# Patient Record
Sex: Male | Born: 1935 | Race: White | Hispanic: No | Marital: Single
Health system: Southern US, Community
[De-identification: ages and names within clinical notes are randomized; demographics above are authoritative.]

---

## 2003-03-21 ENCOUNTER — Other Ambulatory Visit: Payer: Self-pay

## 2003-06-16 ENCOUNTER — Other Ambulatory Visit: Payer: Self-pay

## 2004-05-05 ENCOUNTER — Ambulatory Visit: Payer: Self-pay | Admitting: Internal Medicine

## 2004-07-20 ENCOUNTER — Ambulatory Visit: Payer: Self-pay | Admitting: Internal Medicine

## 2004-09-14 ENCOUNTER — Emergency Department: Payer: Self-pay | Admitting: Emergency Medicine

## 2004-09-21 ENCOUNTER — Ambulatory Visit: Payer: Self-pay | Admitting: Internal Medicine

## 2007-02-16 ENCOUNTER — Ambulatory Visit: Payer: Self-pay | Admitting: Internal Medicine

## 2007-10-13 ENCOUNTER — Ambulatory Visit: Payer: Self-pay | Admitting: Internal Medicine

## 2007-11-12 ENCOUNTER — Ambulatory Visit: Payer: Self-pay | Admitting: Unknown Physician Specialty

## 2008-09-24 ENCOUNTER — Ambulatory Visit: Payer: Self-pay | Admitting: Internal Medicine

## 2008-10-09 ENCOUNTER — Emergency Department: Payer: Self-pay | Admitting: Internal Medicine

## 2008-12-30 ENCOUNTER — Ambulatory Visit: Payer: Self-pay | Admitting: Internal Medicine

## 2011-03-07 ENCOUNTER — Ambulatory Visit: Payer: Self-pay | Admitting: Internal Medicine

## 2011-09-29 ENCOUNTER — Inpatient Hospital Stay: Payer: Self-pay

## 2011-09-29 LAB — CBC
HCT: 35.7 % — ABNORMAL LOW (ref 40.0–52.0)
HGB: 11.5 g/dL — ABNORMAL LOW (ref 13.0–18.0)
MCH: 31.1 pg (ref 26.0–34.0)
MCHC: 32.1 g/dL (ref 32.0–36.0)
Platelet: 235 10*3/uL (ref 150–440)
RDW: 14.8 % — ABNORMAL HIGH (ref 11.5–14.5)
WBC: 9.3 10*3/uL (ref 3.8–10.6)

## 2011-09-29 LAB — COMPREHENSIVE METABOLIC PANEL
Albumin: 3.4 g/dL (ref 3.4–5.0)
Alkaline Phosphatase: 77 U/L (ref 50–136)
BUN: 36 mg/dL — ABNORMAL HIGH (ref 7–18)
Bilirubin,Total: 0.5 mg/dL (ref 0.2–1.0)
Chloride: 108 mmol/L — ABNORMAL HIGH (ref 98–107)
Creatinine: 1.49 mg/dL — ABNORMAL HIGH (ref 0.60–1.30)
Glucose: 90 mg/dL (ref 65–99)
Osmolality: 289 (ref 275–301)
Potassium: 4.2 mmol/L (ref 3.5–5.1)
SGOT(AST): 28 U/L (ref 15–37)
Sodium: 141 mmol/L (ref 136–145)
Total Protein: 7.3 g/dL (ref 6.4–8.2)

## 2011-09-29 LAB — URINALYSIS, COMPLETE
Bilirubin,UR: NEGATIVE
Blood: NEGATIVE
Glucose,UR: NEGATIVE mg/dL (ref 0–75)
Ketone: NEGATIVE
Leukocyte Esterase: NEGATIVE
Nitrite: NEGATIVE
Ph: 6 (ref 4.5–8.0)
RBC,UR: <1 /HPF (ref 0–5)
Squamous Epithelial: NONE SEEN
WBC UR: <1 /HPF (ref 0–5)

## 2011-09-29 LAB — PROTIME-INR
INR: 1
Prothrombin Time: 13.9 secs (ref 11.5–14.7)

## 2011-09-30 LAB — CBC WITH DIFFERENTIAL/PLATELET
Basophil #: 0 10*3/uL (ref 0.0–0.1)
Eosinophil #: 0.4 10*3/uL (ref 0.0–0.7)
Eosinophil %: 5.6 %
HGB: 10.1 g/dL — ABNORMAL LOW (ref 13.0–18.0)
MCH: 31.4 pg (ref 26.0–34.0)
MCV: 97 fL (ref 80–100)
Monocyte #: 0.8 x10 3/mm (ref 0.2–1.0)
Neutrophil %: 54.2 %
Platelet: 198 10*3/uL (ref 150–440)
RBC: 3.21 10*6/uL — ABNORMAL LOW (ref 4.40–5.90)
RDW: 15.1 % — ABNORMAL HIGH (ref 11.5–14.5)

## 2011-09-30 LAB — BASIC METABOLIC PANEL
BUN: 27 mg/dL — ABNORMAL HIGH (ref 7–18)
Calcium, Total: 8 mg/dL — ABNORMAL LOW (ref 8.5–10.1)
Co2: 26 mmol/L (ref 21–32)
Creatinine: 1.4 mg/dL — ABNORMAL HIGH (ref 0.60–1.30)
EGFR (African American): 51 — ABNORMAL LOW
Glucose: 90 mg/dL (ref 65–99)
Osmolality: 290 (ref 275–301)
Potassium: 4.2 mmol/L (ref 3.5–5.1)
Sodium: 143 mmol/L (ref 136–145)

## 2011-09-30 LAB — MAGNESIUM: Magnesium: 2 mg/dL

## 2011-10-01 LAB — CBC WITH DIFFERENTIAL/PLATELET
Eosinophil %: 3.7 %
HCT: 28.3 % — ABNORMAL LOW (ref 40.0–52.0)
Lymphocyte #: 2.3 10*3/uL (ref 1.0–3.6)
MCH: 32.2 pg (ref 26.0–34.0)
MCHC: 33.2 g/dL (ref 32.0–36.0)
MCV: 97 fL (ref 80–100)
Monocyte #: 1.1 x10 3/mm — ABNORMAL HIGH (ref 0.2–1.0)
Monocyte %: 11.8 %
Neutrophil #: 5.3 10*3/uL (ref 1.4–6.5)
Neutrophil %: 59 %
RDW: 14.7 % — ABNORMAL HIGH (ref 11.5–14.5)
WBC: 8.9 10*3/uL (ref 3.8–10.6)

## 2011-10-01 LAB — BASIC METABOLIC PANEL
BUN: 22 mg/dL — ABNORMAL HIGH (ref 7–18)
Chloride: 112 mmol/L — ABNORMAL HIGH (ref 98–107)
Co2: 26 mmol/L (ref 21–32)
Creatinine: 1.3 mg/dL (ref 0.60–1.30)
EGFR (African American): 55 — ABNORMAL LOW
Potassium: 4.4 mmol/L (ref 3.5–5.1)

## 2011-10-02 LAB — CBC WITH DIFFERENTIAL/PLATELET
Basophil #: 0 10*3/uL (ref 0.0–0.1)
Eosinophil #: 0.4 10*3/uL (ref 0.0–0.7)
Eosinophil %: 3.9 %
Lymphocyte #: 3.5 10*3/uL (ref 1.0–3.6)
MCH: 32.2 pg (ref 26.0–34.0)
MCHC: 33 g/dL (ref 32.0–36.0)
MCV: 97 fL (ref 80–100)
Monocyte #: 1.1 x10 3/mm — ABNORMAL HIGH (ref 0.2–1.0)
Neutrophil #: 4.7 10*3/uL (ref 1.4–6.5)
Platelet: 214 10*3/uL (ref 150–440)
RBC: 3.18 10*6/uL — ABNORMAL LOW (ref 4.40–5.90)
RDW: 15.1 % — ABNORMAL HIGH (ref 11.5–14.5)
WBC: 9.7 10*3/uL (ref 3.8–10.6)

## 2011-12-27 ENCOUNTER — Observation Stay: Payer: Self-pay

## 2011-12-27 LAB — PRO B NATRIURETIC PEPTIDE: B-Type Natriuretic Peptide: 313 pg/mL (ref 0–450)

## 2011-12-27 LAB — CK TOTAL AND CKMB (NOT AT ARMC): CK-MB: 2.4 ng/mL (ref 0.5–3.6)

## 2011-12-28 LAB — BASIC METABOLIC PANEL
Anion Gap: 5 — ABNORMAL LOW (ref 7–16)
Calcium, Total: 8.6 mg/dL (ref 8.5–10.1)
Chloride: 109 mmol/L — ABNORMAL HIGH (ref 98–107)
Co2: 27 mmol/L (ref 21–32)
Osmolality: 285 (ref 275–301)

## 2011-12-28 LAB — TROPONIN I: Troponin-I: 0.06 ng/mL — ABNORMAL HIGH

## 2011-12-29 LAB — BASIC METABOLIC PANEL
Anion Gap: 7 (ref 7–16)
Calcium, Total: 8.2 mg/dL — ABNORMAL LOW (ref 8.5–10.1)
Chloride: 112 mmol/L — ABNORMAL HIGH (ref 98–107)
Co2: 25 mmol/L (ref 21–32)
Creatinine: 1.58 mg/dL — ABNORMAL HIGH (ref 0.60–1.30)
EGFR (African American): 44 — ABNORMAL LOW
Osmolality: 290 (ref 275–301)

## 2012-05-07 ENCOUNTER — Emergency Department: Payer: Self-pay | Admitting: Emergency Medicine

## 2012-05-07 LAB — COMPREHENSIVE METABOLIC PANEL
Albumin: 3.1 g/dL — ABNORMAL LOW (ref 3.4–5.0)
Alkaline Phosphatase: 121 U/L (ref 50–136)
Anion Gap: 6 — ABNORMAL LOW (ref 7–16)
BUN: 21 mg/dL — ABNORMAL HIGH (ref 7–18)
Bilirubin,Total: 0.5 mg/dL (ref 0.2–1.0)
Calcium, Total: 8.9 mg/dL (ref 8.5–10.1)
Chloride: 105 mmol/L (ref 98–107)
Co2: 28 mmol/L (ref 21–32)
Creatinine: 1.63 mg/dL — ABNORMAL HIGH (ref 0.60–1.30)
EGFR (African American): 42 — ABNORMAL LOW
EGFR (Non-African Amer.): 36 — ABNORMAL LOW
Glucose: 94 mg/dL (ref 65–99)
Osmolality: 280 (ref 275–301)
Potassium: 4.2 mmol/L (ref 3.5–5.1)
SGOT(AST): 25 U/L (ref 15–37)
SGPT (ALT): 17 U/L (ref 12–78)
Sodium: 139 mmol/L (ref 136–145)
Total Protein: 8.3 g/dL — ABNORMAL HIGH (ref 6.4–8.2)

## 2012-05-07 LAB — URINALYSIS, COMPLETE
Bilirubin,UR: NEGATIVE
Blood: NEGATIVE
Leukocyte Esterase: NEGATIVE
Nitrite: NEGATIVE
Protein: 25
RBC,UR: 1 /HPF (ref 0–5)
WBC UR: 1 /HPF (ref 0–5)

## 2012-05-07 LAB — CBC WITH DIFFERENTIAL/PLATELET
Basophil #: 0 10*3/uL (ref 0.0–0.1)
Basophil %: 0.3 %
Eosinophil #: 0.2 10*3/uL (ref 0.0–0.7)
HCT: 38.2 % — ABNORMAL LOW (ref 40.0–52.0)
Lymphocyte #: 2.5 10*3/uL (ref 1.0–3.6)
Lymphocyte %: 23.2 %
MCH: 30.3 pg (ref 26.0–34.0)
MCHC: 33.2 g/dL (ref 32.0–36.0)
Monocyte %: 11.1 %
Neutrophil #: 6.8 10*3/uL — ABNORMAL HIGH (ref 1.4–6.5)
Neutrophil %: 63.4 %
Platelet: 589 10*3/uL — ABNORMAL HIGH (ref 150–440)
RDW: 15.9 % — ABNORMAL HIGH (ref 11.5–14.5)

## 2012-05-07 LAB — TROPONIN I: Troponin-I: 0.03 ng/mL

## 2012-05-07 LAB — CK TOTAL AND CKMB (NOT AT ARMC): CK, Total: 78 U/L (ref 35–232)

## 2012-05-15 ENCOUNTER — Inpatient Hospital Stay: Payer: Self-pay

## 2012-05-15 LAB — URINALYSIS, COMPLETE
Bilirubin,UR: NEGATIVE
Blood: NEGATIVE
Glucose,UR: NEGATIVE mg/dL (ref 0–75)
Hyaline Cast: 1
Ketone: NEGATIVE
Leukocyte Esterase: NEGATIVE
Nitrite: NEGATIVE
Ph: 6 (ref 4.5–8.0)
Protein: NEGATIVE
RBC,UR: NONE SEEN /HPF (ref 0–5)
Specific Gravity: 1.009 (ref 1.003–1.030)
WBC UR: NONE SEEN /HPF (ref 0–5)

## 2012-05-15 LAB — COMPREHENSIVE METABOLIC PANEL
Alkaline Phosphatase: 95 U/L (ref 50–136)
Bilirubin,Total: 0.6 mg/dL (ref 0.2–1.0)
Calcium, Total: 8.7 mg/dL (ref 8.5–10.1)
Co2: 27 mmol/L (ref 21–32)
Creatinine: 1.59 mg/dL — ABNORMAL HIGH (ref 0.60–1.30)
EGFR (African American): 43 — ABNORMAL LOW
EGFR (Non-African Amer.): 37 — ABNORMAL LOW
Glucose: 90 mg/dL (ref 65–99)
Osmolality: 277 (ref 275–301)
Potassium: 4.1 mmol/L (ref 3.5–5.1)
SGOT(AST): 25 U/L (ref 15–37)
SGPT (ALT): 17 U/L (ref 12–78)

## 2012-05-15 LAB — CBC
HCT: 36.5 % — ABNORMAL LOW (ref 40.0–52.0)
HGB: 11.9 g/dL — ABNORMAL LOW (ref 13.0–18.0)
MCH: 29.6 pg (ref 26.0–34.0)
MCHC: 32.6 g/dL (ref 32.0–36.0)
MCV: 91 fL (ref 80–100)
Platelet: 559 10*3/uL — ABNORMAL HIGH (ref 150–440)
RBC: 4.02 10*6/uL — ABNORMAL LOW (ref 4.40–5.90)
WBC: 11 10*3/uL — ABNORMAL HIGH (ref 3.8–10.6)

## 2012-05-15 LAB — CK TOTAL AND CKMB (NOT AT ARMC): CK-MB: 1.3 ng/mL (ref 0.5–3.6)

## 2012-05-16 LAB — CBC WITH DIFFERENTIAL/PLATELET
Eosinophil #: 0 10*3/uL (ref 0.0–0.7)
Eosinophil %: 0.1 %
HCT: 34.4 % — ABNORMAL LOW (ref 40.0–52.0)
HGB: 11.3 g/dL — ABNORMAL LOW (ref 13.0–18.0)
Lymphocyte #: 0.8 10*3/uL — ABNORMAL LOW (ref 1.0–3.6)
MCH: 29.7 pg (ref 26.0–34.0)
MCHC: 32.8 g/dL (ref 32.0–36.0)
Monocyte %: 1.3 %
Neutrophil %: 89 %
RDW: 15 % — ABNORMAL HIGH (ref 11.5–14.5)

## 2012-05-16 LAB — BASIC METABOLIC PANEL
Calcium, Total: 8.3 mg/dL — ABNORMAL LOW (ref 8.5–10.1)
Co2: 23 mmol/L (ref 21–32)
Glucose: 116 mg/dL — ABNORMAL HIGH (ref 65–99)
Potassium: 4.1 mmol/L (ref 3.5–5.1)

## 2012-05-21 LAB — CULTURE, BLOOD (SINGLE)

## 2012-05-22 DIAGNOSIS — M48061 Spinal stenosis, lumbar region without neurogenic claudication: Secondary | ICD-10-CM

## 2012-05-22 DIAGNOSIS — F329 Major depressive disorder, single episode, unspecified: Secondary | ICD-10-CM

## 2012-05-22 DIAGNOSIS — K219 Gastro-esophageal reflux disease without esophagitis: Secondary | ICD-10-CM

## 2012-05-22 DIAGNOSIS — G589 Mononeuropathy, unspecified: Secondary | ICD-10-CM

## 2012-05-30 ENCOUNTER — Inpatient Hospital Stay: Payer: Self-pay

## 2012-05-30 LAB — CBC
HGB: 12.1 g/dL — ABNORMAL LOW (ref 13.0–18.0)
MCH: 29.1 pg (ref 26.0–34.0)
MCV: 90 fL (ref 80–100)
Platelet: 502 10*3/uL — ABNORMAL HIGH (ref 150–440)
RBC: 4.17 10*6/uL — ABNORMAL LOW (ref 4.40–5.90)
WBC: 13.2 10*3/uL — ABNORMAL HIGH (ref 3.8–10.6)

## 2012-05-30 LAB — URINALYSIS, COMPLETE
Bilirubin,UR: NEGATIVE
Blood: NEGATIVE
Glucose,UR: NEGATIVE mg/dL (ref 0–75)
Hyaline Cast: 10
Ketone: NEGATIVE
Nitrite: NEGATIVE
Ph: 5 (ref 4.5–8.0)
Protein: NEGATIVE
Specific Gravity: 1.02 (ref 1.003–1.030)
Squamous Epithelial: NONE SEEN

## 2012-05-30 LAB — COMPREHENSIVE METABOLIC PANEL
Alkaline Phosphatase: 94 U/L (ref 50–136)
Bilirubin,Total: 0.3 mg/dL (ref 0.2–1.0)
Calcium, Total: 8.5 mg/dL (ref 8.5–10.1)
Creatinine: 1.61 mg/dL — ABNORMAL HIGH (ref 0.60–1.30)
EGFR (Non-African Amer.): 37 — ABNORMAL LOW
Glucose: 84 mg/dL (ref 65–99)
SGOT(AST): 27 U/L (ref 15–37)
SGPT (ALT): 22 U/L (ref 12–78)
Total Protein: 6.8 g/dL (ref 6.4–8.2)

## 2012-05-30 LAB — LIPID PANEL
Ldl Cholesterol, Calc: 69 mg/dL (ref 0–100)
Triglycerides: 169 mg/dL (ref 0–200)
VLDL Cholesterol, Calc: 34 mg/dL (ref 5–40)

## 2012-05-30 LAB — PROTIME-INR
INR: 1.1
Prothrombin Time: 14.1 secs (ref 11.5–14.7)

## 2012-05-30 LAB — CK TOTAL AND CKMB (NOT AT ARMC): CK, Total: 44 U/L (ref 35–232)

## 2012-05-30 LAB — TROPONIN I: Troponin-I: 0.03 ng/mL

## 2012-05-30 LAB — TSH: Thyroid Stimulating Horm: 0.35 u[IU]/mL — ABNORMAL LOW

## 2012-05-31 LAB — URINALYSIS, COMPLETE
Blood: NEGATIVE
Leukocyte Esterase: NEGATIVE
Nitrite: NEGATIVE
Ph: 6 (ref 4.5–8.0)
RBC,UR: NONE SEEN /HPF (ref 0–5)
Squamous Epithelial: <1

## 2012-05-31 LAB — CBC WITH DIFFERENTIAL/PLATELET
Eosinophil #: 0.3 10*3/uL (ref 0.0–0.7)
Eosinophil %: 2.5 %
HCT: 35.3 % — ABNORMAL LOW (ref 40.0–52.0)
Lymphocyte #: 1.8 10*3/uL (ref 1.0–3.6)
MCV: 90 fL (ref 80–100)
Monocyte %: 9.4 %
Neutrophil %: 72.7 %
RBC: 3.93 10*6/uL — ABNORMAL LOW (ref 4.40–5.90)
RDW: 15.2 % — ABNORMAL HIGH (ref 11.5–14.5)
WBC: 12.9 10*3/uL — ABNORMAL HIGH (ref 3.8–10.6)

## 2012-05-31 LAB — COMPREHENSIVE METABOLIC PANEL
Albumin: 2.2 g/dL — ABNORMAL LOW (ref 3.4–5.0)
Alkaline Phosphatase: 83 U/L (ref 50–136)
Anion Gap: 7 (ref 7–16)
Bilirubin,Total: 0.4 mg/dL (ref 0.2–1.0)
Co2: 25 mmol/L (ref 21–32)
Creatinine: 1.36 mg/dL — ABNORMAL HIGH (ref 0.60–1.30)
Glucose: 97 mg/dL (ref 65–99)
Potassium: 4.7 mmol/L (ref 3.5–5.1)
SGPT (ALT): 21 U/L (ref 12–78)

## 2012-05-31 LAB — PROTIME-INR: INR: 1.1

## 2012-06-04 DIAGNOSIS — E785 Hyperlipidemia, unspecified: Secondary | ICD-10-CM

## 2012-06-04 DIAGNOSIS — I4892 Unspecified atrial flutter: Secondary | ICD-10-CM

## 2012-06-04 DIAGNOSIS — F329 Major depressive disorder, single episode, unspecified: Secondary | ICD-10-CM

## 2012-06-04 DIAGNOSIS — R627 Adult failure to thrive: Secondary | ICD-10-CM

## 2012-06-11 DIAGNOSIS — R05 Cough: Secondary | ICD-10-CM

## 2012-06-19 DIAGNOSIS — R0902 Hypoxemia: Secondary | ICD-10-CM

## 2012-06-26 DIAGNOSIS — K219 Gastro-esophageal reflux disease without esophagitis: Secondary | ICD-10-CM

## 2012-06-26 DIAGNOSIS — I4891 Unspecified atrial fibrillation: Secondary | ICD-10-CM

## 2012-06-26 DIAGNOSIS — F068 Other specified mental disorders due to known physiological condition: Secondary | ICD-10-CM

## 2012-06-26 DIAGNOSIS — F329 Major depressive disorder, single episode, unspecified: Secondary | ICD-10-CM

## 2012-06-30 ENCOUNTER — Inpatient Hospital Stay: Payer: Self-pay

## 2012-06-30 LAB — COMPREHENSIVE METABOLIC PANEL
Albumin: 2.2 g/dL — ABNORMAL LOW (ref 3.4–5.0)
Anion Gap: 5 — ABNORMAL LOW (ref 7–16)
Bilirubin,Total: 0.3 mg/dL (ref 0.2–1.0)
Chloride: 101 mmol/L (ref 98–107)
EGFR (African American): 52 — ABNORMAL LOW
Glucose: 109 mg/dL — ABNORMAL HIGH (ref 65–99)
SGOT(AST): 108 U/L — ABNORMAL HIGH (ref 15–37)
Total Protein: 8 g/dL (ref 6.4–8.2)

## 2012-06-30 LAB — URINALYSIS, COMPLETE
Bilirubin,UR: NEGATIVE
Blood: NEGATIVE
Glucose,UR: NEGATIVE mg/dL (ref 0–75)
Ketone: NEGATIVE
Ph: 6 (ref 4.5–8.0)
Protein: 30
RBC,UR: 2 /HPF (ref 0–5)
WBC UR: 1 /HPF (ref 0–5)

## 2012-06-30 LAB — CBC
HCT: 40 % (ref 40.0–52.0)
HGB: 13 g/dL (ref 13.0–18.0)
MCH: 28.4 pg (ref 26.0–34.0)
MCHC: 32.4 g/dL (ref 32.0–36.0)
MCV: 88 fL (ref 80–100)
RBC: 4.58 10*6/uL (ref 4.40–5.90)

## 2012-06-30 LAB — TSH: Thyroid Stimulating Horm: 0.634 u[IU]/mL

## 2012-07-01 LAB — CBC WITH DIFFERENTIAL/PLATELET
Basophil #: 0.1 10*3/uL (ref 0.0–0.1)
Basophil %: 0.6 %
Eosinophil #: 0.4 10*3/uL (ref 0.0–0.7)
HGB: 11.5 g/dL — ABNORMAL LOW (ref 13.0–18.0)
Lymphocyte #: 2.8 10*3/uL (ref 1.0–3.6)
Lymphocyte %: 21.6 %
MCHC: 32.7 g/dL (ref 32.0–36.0)
MCV: 87 fL (ref 80–100)
Monocyte #: 1.4 x10 3/mm — ABNORMAL HIGH (ref 0.2–1.0)
Neutrophil #: 8.3 10*3/uL — ABNORMAL HIGH (ref 1.4–6.5)
Platelet: 470 10*3/uL — ABNORMAL HIGH (ref 150–440)
RDW: 15.5 % — ABNORMAL HIGH (ref 11.5–14.5)

## 2012-07-01 LAB — COMPREHENSIVE METABOLIC PANEL
Alkaline Phosphatase: 96 U/L (ref 50–136)
BUN: 16 mg/dL (ref 7–18)
Bilirubin,Total: 0.4 mg/dL (ref 0.2–1.0)
Calcium, Total: 8.4 mg/dL — ABNORMAL LOW (ref 8.5–10.1)
Chloride: 104 mmol/L (ref 98–107)
Co2: 29 mmol/L (ref 21–32)
Creatinine: 1.3 mg/dL (ref 0.60–1.30)
EGFR (African American): 55 — ABNORMAL LOW
Glucose: 90 mg/dL (ref 65–99)
Osmolality: 278 (ref 275–301)
SGPT (ALT): 62 U/L (ref 12–78)

## 2012-07-01 LAB — MAGNESIUM: Magnesium: 1.9 mg/dL

## 2012-07-02 ENCOUNTER — Telehealth: Payer: Self-pay | Admitting: Family Medicine

## 2012-07-02 LAB — BASIC METABOLIC PANEL
Anion Gap: 5 — ABNORMAL LOW (ref 7–16)
Calcium, Total: 8.1 mg/dL — ABNORMAL LOW (ref 8.5–10.1)
Chloride: 105 mmol/L (ref 98–107)
Creatinine: 1.36 mg/dL — ABNORMAL HIGH (ref 0.60–1.30)
EGFR (Non-African Amer.): 45 — ABNORMAL LOW
Sodium: 138 mmol/L (ref 136–145)

## 2012-07-02 LAB — CBC WITH DIFFERENTIAL/PLATELET
Basophil #: 0.1 10*3/uL (ref 0.0–0.1)
Basophil %: 0.6 %
Eosinophil #: 0.3 10*3/uL (ref 0.0–0.7)
Eosinophil %: 2.4 %
HCT: 32.6 % — ABNORMAL LOW (ref 40.0–52.0)
HGB: 10.6 g/dL — ABNORMAL LOW (ref 13.0–18.0)
MCHC: 32.6 g/dL (ref 32.0–36.0)
MCV: 87 fL (ref 80–100)
Neutrophil #: 7 10*3/uL — ABNORMAL HIGH (ref 1.4–6.5)
Neutrophil %: 61 %
Platelet: 423 10*3/uL (ref 150–440)
WBC: 11.5 10*3/uL — ABNORMAL HIGH (ref 3.8–10.6)

## 2012-07-02 LAB — URINE CULTURE

## 2012-07-02 NOTE — Telephone Encounter (Signed)
[facility] Triage Call Report Triage Record Num: [id number] Operator: Caswell Corwin Patient Name: Joel Lawson Call Date & Time: 06/30/2012 1:45:10PM Patient Phone: [phone] PCP: Tillman Abide Patient Gender: Male PCP Fax : [phone] Patient DOB: 09-11-1917 Practice Name: [facility] Reason for Call: Caller: Tamela Oddi RN From [facility]/Other; PCP: Tillman Abide [facility]); CB#: [phone]; Call regarding: Pt sent out to the hospital for lethargy. Pt was more alert this AM and took his AM med , but could hardly open his mouth for lunch. He was sent out by staff to [facility]. Protocol(s) Used: PCP Calls, No Triage (Adult) Recommended Outcome per Protocol: Call Provider Immediately Reason for Outcome: Notification of hospital admission Care Advice: ~ 06/30/2012 1:50:00PM Page 1 of 1 [id number]

## 2012-07-02 NOTE — Telephone Encounter (Signed)
Didn't hear about that this morning Will review tomorrow when there

## 2012-07-04 LAB — BASIC METABOLIC PANEL
Anion Gap: 8 (ref 7–16)
BUN: 8 mg/dL (ref 7–18)
Calcium, Total: 8.5 mg/dL (ref 8.5–10.1)
Chloride: 103 mmol/L (ref 98–107)
Creatinine: 1.17 mg/dL (ref 0.60–1.30)
EGFR (African American): >60
Potassium: 2.9 mmol/L — ABNORMAL LOW (ref 3.5–5.1)
Sodium: 139 mmol/L (ref 136–145)

## 2012-07-04 LAB — POTASSIUM: Potassium: 3.7 mmol/L (ref 3.5–5.1)

## 2012-07-05 DIAGNOSIS — I4891 Unspecified atrial fibrillation: Secondary | ICD-10-CM

## 2012-07-05 DIAGNOSIS — F068 Other specified mental disorders due to known physiological condition: Secondary | ICD-10-CM

## 2012-07-05 DIAGNOSIS — J690 Pneumonitis due to inhalation of food and vomit: Secondary | ICD-10-CM

## 2012-07-06 LAB — CULTURE, BLOOD (SINGLE)

## 2012-07-16 ENCOUNTER — Telehealth: Payer: Self-pay

## 2012-07-16 NOTE — Telephone Encounter (Signed)
Noted  

## 2012-07-16 NOTE — Telephone Encounter (Signed)
To: [facility] (After Hours Triage) Fax: [phone] From: [facility] Date/ Time: 07/16/2012 4:33 AM Taken By: Edgar Frisk, RN Caller: Tammy Facility: [name] Patient: Joel Lawson, Joel Lawson DOB: [birth year] Phone: [phone] Reason for Call: [name] is calling from [facility] regarding the death of [name]. Patient of [name]. The patient expired on 07/16/2012 at 04:18. Regarding Appointment:

## 2012-07-26 DEATH — deceased

## 2012-08-29 ENCOUNTER — Ambulatory Visit: Payer: Self-pay | Admitting: Internal Medicine

## 2014-07-15 NOTE — Discharge Summary (Signed)
PATIENT NAME:  Katina DungSCHMIDT, Waldon MR#:  [id number] DATE OF BIRTH:  15-Apr-1917  DATE OF ADMISSION:  12/27/2011 DATE OF DISCHARGE: 12/29/2011  PRIMARY CARE PHYSICIAN: Clydie Braunavid Erich Kochan, MD  ADMITTING PHYSICIAN: Clydie Braunavid Maurica Omura, MD  DISCHARGE DIAGNOSES:  1. Acute renal failure.  2. Dehydration.  3. Tachycardia.  HISTORY OF PRESENT ILLNESS: This is a pleasant 81 year old gentleman who lives alone in [place]. He is widowed. He came to clinic for his first visit on 12/27/2011 and was found to be very orthostatic, to have presyncopal episodes, appeared very dehydrated, and to have elevated creatinine above his baseline chronic failure. He was admitted for evaluation and treatment.   HOSPITAL COURSE BY ISSUE: 1. Acute renal failure. His creatinine had increased from 1.5 to 2.0. He was given IV fluids 2 liters and this decreased to around his baseline of 1.58.  2. Dizziness and orthostasis. This was likely due to the dehydration and acute renal failure. It resolved with treatment. He had an echocardiogram done which was normal. His blood work was also otherwise unrevealing except for very slight increase in his troponin to 0.06, which is likely due to supply/demand ischemia.  3. Debility. The patient was seen by physical therapy who recommended home physical therapy, but the patient has refused. He is relatively steady on his feet and will follow up at [facility] as needed.  4. Dehydration. This resolved with IV fluids. The patient was encouraged to continue to drink lots of fluid at home.  DISCHARGE MEDICATIONS: 1. Zoloft 50 mg once a day.  2. Simvastatin 40 mg once a day.  3. Omeprazole 40 mg once a day.  5. Vitamin D3 1000 units orally.  DISCHARGE DIET: Regular with lots of fluids. Can add salt to your  meals.   DISCHARGE ACTIVITY: As tolerated.   DISCHARGE FOLLOWUP/INSTRUCTIONS: The patient is to return to the clinic in 1 to 2 weeks for follow-up. He will call if there are further  symptoms. ____________________________ Stann Mainlandavid P. Sampson GoonFitzgerald, MD dpf:slb D: 12/29/2011 12:42:12 ET T: 12/29/2011 14:42:35 ET JOB#: [id number]  cc: Stann Mainlandavid P. Sampson GoonFitzgerald, MD, <Dictator> Ceniya Fowers Sampson GoonFITZGERALD MD ELECTRONICALLY SIGNED 01/03/2012 21:46

## 2014-07-15 NOTE — H&P (Signed)
PATIENT NAME:  Joel Lawson, SCHIELE MR#:  [id number] DATE OF BIRTH:  [birth year]  DATE OF ADMISSION:  12/27/2011  PRIMARY CARE PHYSICIAN: Clydie Braun, MD   REASON FOR ADMISSION/CHIEF COMPLAINT: Dizziness and palpitations.   HISTORY OF PRESENT ILLNESS: This is a pleasant 81 year old gentleman who currently lives alone. He presented today to the clinic with complaints of ongoing worsening dizziness. He has a baseline history of depression, diabetes, mild hypertension, prior TIA, and recent GI bleed requiring admission in July 2013 presumably due to diverticulitis. He was last seen by Dr. Candelaria Stagers on July 17th in follow-up for that hospitalization.   In the clinic the patient was noted to be tachycardic, hypotensive, and quite orthostatic. He reported no chest pain or shortness of breath but some mild lower quadrant abdominal pain. He has not had any fevers, chills, or night sweats. He says he is not drinking as much as he should.   PAST MEDICAL HISTORY:  1. Recent GI bleed July of 2007 presumably due to diverticulosis. He had a negative upper endoscopy.  2. Depression.  3. Diabetes versus hyperglycemia.  4. Hypertension, although not currently on meds.  5. Reflux and Barrett's esophagus.  6. 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  7. Hyperlipidemia.  8. Spinal stenosis.  9. 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   PAST SURGICAL HISTORY:  1. Tonsillectomy.  2. Bilateral cataract replacement.  3. Left carotid surgery in 2005.   SOCIAL HISTORY: The patient is a widower. He lives alone. He is at [facility]. He has a 30-pack-year smoking history but quit 30 years ago.   FAMILY HISTORY: His father had a pacemaker.   ALLERGIES: The patient is allergic to penicillin.   MEDICATIONS CURRENTLY INCLUDE:  1. Calcium 500 one tablet a day.  2. Multivitamin.  3. Omeprazole 40 mg once a day.  4. Zocor 40 mg once a day.  5. Zoloft 50 mg once a day.  6. Neosporin once a day.  REVIEW OF  SYSTEMS: 11 systems reviewed and negative except as per HPI.   PHYSICAL EXAMINATION:   GENERAL: The patient is a thin elderly male who appears frail but is in some distress and dizzy upon standing.   VITAL SIGNS: Temperature 98.9, pulse 108, blood pressure 98/60, weight 191 pounds, sat 95% on room air.   HEENT: Pupils were equal, round, and reactive to light and accommodation. He had evidence of cataract surgery. Extraocular movements were intact. Extraocular muscles were symmetric. Oropharynx showed mucous membranes were dry.   NECK: Supple.   HEART: Heart is regular but tachy.   LUNGS: Lungs had bilateral crackles.   ABDOMEN: Abdomen soft, nontender, nondistended. No hepatosplenomegaly.   EXTREMITIES: No clubbing, cyanosis, or edema.   NEUROLOGIC: He is alert and oriented x3, grossly nonfocal neuro exam.   LABORATORY, DIAGNOSTIC, AND RADIOLOGICAL DATA: EKG done revealed sinus tach with evidence of possible ischemia.   Labs done in clinic revealed a white count of 7.0, hemoglobin 12.2, platelets 256. Basic metabolic panel showed a BUN of 25, creatinine of 2.0 which is above his baseline of 1.4, glucose 112, sodium 138, potassium 4.2, chloride 106, bicarb 25.   On admission to the hospital cardiac enzymes were done. This revealed a CK of 80, MB of 2.4, and troponin of 0.03.   He has had a chest x-ray done on admission which showed mild atelectasis versus pneumonia. Left lung base is clear.   IMPRESSION: This is a 81 year old gentleman who is relatively healthy and lives  alone but now has orthostatic hypotension and tachycardia. He has a history of TIA and a GI bleed. He is admitted to evaluate his cardiac status and make sure he is not having any ongoing ischemia with an abnormal EKG.   PLAN:  1. Will cycle cardiac enzymes.  2. He is given IV fluids, normal saline at 125 mL an hour.  3. Will check an echocardiogram to evaluate his left ventricular function.  4. Will consider  carotid Doppler's given the dizziness above that would not explain orthostatic hypotension.  5. He will be placed on SCDs for DVT prophylaxis.   ____________________________ Stann Mainlandavid P. Sampson GoonFitzgerald, MD dpf:drc D: 12/27/2011 20:51:38 ET T: 12/28/2011 06:14:24 ET JOB#: [id number]  cc: Stann Mainlandavid P. Sampson GoonFitzgerald, MD, <Dictator> Jihan Mellette Sampson GoonFITZGERALD MD ELECTRONICALLY SIGNED 01/03/2012 21:46

## 2014-07-18 NOTE — H&P (Signed)
PATIENT NAME:  Katina DungSCHMIDT, Antavius MR#:  [id number] DATE OF BIRTH:  [birth year]  DATE OF ADMISSION:  05/15/2012  CHIEF COMPLAINT: Falls, weakness.   HISTORY OF PRESENT ILLNESS: This is a 81 year old male who has been having a very difficult time over the past few weeks with falls multiple times, hitting his head, chest, etc. He has been lying in bed, not getting up well, not eating and drinking according to his daughter. History of back disease and back pain. He takes a lot of Aleve for that despite his chronic kidney disease. His creatinine is 1.72 of late at [facility]. He was hungry and wished to eat. No cough, shortness of breath noted. He was seen at [facility] yesterday and started on Levaquin for a possible URI and was asked to have close followup. He is somewhat confused, apparently is more confused at times when he does not eat well, and again has been doing that more lately. History of neuropathy, possible spinal stenosis though that term has not been told to the daughter or patient as best they know. May have a distant history of diabetes, but his glucose has been normal of late.  REVIEW OF SYSTEMS: Negative other than above.   PAST MEDICAL HISTORY:  1.  Depression.  2.  History of glucose intolerance.  3.  Osteoarthritis. 4.  Hypertension.  5.  Sleep apnea. 6.  GERD with history of Barrett's. 7.  History of TIA with left hemiparesis and speech defect, now gone.  8.  History of herpes zoster.  9.  Hyperlipidemia.  10.  Spinal stenosis per the chart.   PAST SURGICAL HISTORY: Tonsillectomy, bilateral cataract replacement and left carotid surgery in 2005 by Dr. Michela PitcherEly.   MEDICATIONS: Per Dr. Jarrett AblesFitzgerald's last note include calcium daily, multivitamin daily, omeprazole 40 mg daily, Flonase 2 sprays daily, sertraline 50 mg daily, simvastatin 40 mg daily.   ALLERGIES: PENICILLIN.   SOCIAL HISTORY: [facility]. Is a widow. History of 30 pack-year cigarettes, quit 30 years ago.  No alcohol noted.   FAMILY HISTORY: Father had a pacemaker from some sort of cardiac conduction system abnormality, unknown exact type per the chart.   PHYSICAL EXAMINATION:  GENERAL: Elderly, mildly confused, lying on the gurney, no acute distress. VITAL SIGNS: Blood pressure 154/88, pulse 87, respirations 18, according to his admission vital signs.  HEENT: Normocephalic. Does have a contusion on his forehead. TMs clear. Oropharynx clear. Nasal mucosa normal.  NECK: No bruits, thyromegaly, adenopathy, JVD.  CHEST: Clear to auscultation, percussion and inspection.  HEART: Regular rate and rhythm without murmurs or gallops. Peripheral pulses decreased.  ABDOMEN: Soft, flat, nontender. No hepatosplenomegaly.  EXTREMITIES: No clubbing, cyanosis, edema. NEUROLOGIC: Slightly decreased strength in his legs. He has marked sensory neuropathy to light touch all the way from his feet to his knees without any feeling. Seems to be nonfocal otherwise.  LABORATORY AND RADIOLOGIC DATA: Labs show normal UA. Head CT and CT of his neck are shown to be normal. Troponin and CKs normal. His creatinine is 1.59, which again is in his usual range. His albumin is decreased to 2.6. WBC is 11, hemoglobin 11.9, platelets 559, MCV 91. Chest x-ray is read by radiology as no infiltrates. I reviewed the chest x-ray myself, did not see any infiltrates either.   ASSESSMENT AND PLAN:  1.  Worsening status with ataxia and confusion with this patient: Cause somewhat unclear, though suspect there is an element of spinal stenosis which may be worsening with his  falls, inability to get around and eat, Melvern Banker, recently. He is unsafe at home now. Will bring him into hospital. Consider MRI of his lumbar spine and empirically give him some prednisone. Get physical therapy to work with him. Suspect he will need admission to a skilled facility for further rehab.  2.  Encephalopathy: Could be from marked lack of food intake, possible  dehydration as well, versus multiple other issues. Will not start any antibiotics at this point. Will watch him to see what his temperature does, his white count, et Karie Soda. I see no focal infections right now.  3.  Chronic kidney disease: Certainly need to stop nonsteroidal antiinflammatory drugs. I told him and his daughter this tonight. Hopefully his renal function will improve some off of that. Will follow.  4.  Neuropathy: As above. Add a B12 and TSH for this as well as his encephalopathy. Hard to tell his baseline. Some of this could be more chronic as well.   ____________________________ Marya Amsler. Dareen Piano, MD mwa:jm D: 05/15/2012 18:14:32 ET T: 05/15/2012 19:21:58 ET JOB#: [id number]  cc: Marya Amsler. Dareen Piano, MD, <Dictator> Lauro Regulus MD ELECTRONICALLY SIGNED 05/16/2012 8:07

## 2014-07-18 NOTE — H&P (Signed)
Past Med/Surgical Hx:  stroke:   diabetes:   chronic back pain:   peripheral neuropathy on lower legs:   Depression:   Hyperlipidemia:   GERD - Esophageal Reflux:   ALLERGIES:  PCN: Other   Assessment/Admission Diagnosis 81 yo male with PMHx of HTN, HLD, Sleep Apnea, Barrett's Esophagus, Esophageal Stricture, now with AMS, admitted for PNA  1. Pneumonia - most likely aspiration, bilateral infiltrates (R>L) - will treat as aspiration PNA, treat with vanc and zosyn (renal dosing) - I suspect he has been silently aspirating given his clinical status and recent CXR findings - give gentle IVFs - puree diet - DNR - daughter at bedside, DNR verfied - check blood and urine culture  2. AMS - secondary to #1  3. Esophageal stricture - most likely cause of aspiration - had dilation by Dr. Bluford Kaufmannh 09/2011 - give PPI - Spoke with Dr. Niel HummerIftikhar, will see patient tomorrow - speech eval - may need to consider feeding tube if no intervention   4. HTN - continue with home meds  5. Renal Insufficieny - most likely chronic - current not worst from last admission, will cont with gentle IVFs  DNR PMD - Dr.Fitzgerald  Time spent evaluating patient = 45 minutes [id number]#[id number]   Electronic Signatures: Stephanie AcreMungal, Craven Crean (MD)  (Signed 05-Apr-14 17:44)  Authored: PAST MEDICAL/SURGIAL HISTORY, ALLERGIES, HOME MEDICATIONS, ASSESSMENT AND PLAN   Last Updated: 05-Apr-14 17:44 by Stephanie AcreMungal, Kyiesha Millward (MD)

## 2014-07-18 NOTE — Consult Note (Signed)
Brief Consult Note: Diagnosis: Aspiration pneumonia.   Patient was seen by consultant.   Comments: Patient with aspiration pneumonia. Detailed history is not available but paient is giving h/o choking and dysphagia. Recent EGD showed only a mild stricture which was dilated to 54 FR. Doubt that aspiration is secondary to esophageal disorder and is most likely secondary to neuromuscular dysphagia. Agree with speech evaluation. Further recommendations to follow.  Electronic Signatures: Lurline DelIftikhar, Dalexa Gentz (MD)  (Signed 06-Apr-14 16:04)  Authored: Brief Consult Note   Last Updated: 06-Apr-14 16:04 by Lurline DelIftikhar, Oliviagrace Crisanti (MD)

## 2014-07-18 NOTE — Discharge Summary (Signed)
PATIENT NAME:  Joel Lawson, [name] MR#:  [id number] DATE OF BIRTH:  18-Mar-1918  DATE OF ADMISSION:  06/30/2012 DATE OF DISCHARGE: 07/04/2012   PRIMARY CARE PHYSICIAN: Clydie Braunavid Khadija Thier.  CONSULTING GASTROENTEROLOGIST: Dr. Niel HummerIftikhar.   DISCHARGE DIAGNOSES:  1.  Aspiration pneumonia.  2.  Atrial flutter.  3.  Debility.   HISTORY OF PRESENT ILLNESS: Please see admission history and physical. Briefly, this is an elderly gentleman, who lives at skilled nursing facility, who came in with altered mental status, cough and fever and found to have a likely aspiration pneumonia on chest x-ray.   HOSPITAL COURSE BY ISSUE:  1.  Aspiration pneumonia. He was treated with vancomycin and Zosyn. He remained afebrile. White count normalized and his oxygenation remained stable. He was seen by speech and swallow, who recommended dietary changes based on his aspiration risk.  2.  Atrial fibrillation and flutter. He remained on him diltiazem and metoprolol. Heart rate was stable.  3.  Volume overload. The patient received some IV fluids, likely became volume overloaded. He received one dose of IV Lasix on the 04/08 with good response.  4.  Debility. The patient was seen by physical therapy, who recommend continued skilled nursing facility as an outpatient.   DISCHARGE MEDICATIONS:  1.  Aleve 220 mg q. 8 hours p.r.n. pain.  2.  Tylenol 325 mg 2 tablets q. 4 hours p.r.n. pain.  3.  Sertraline 50 mg once a day.  4.  Remeron 15 mg at bedtime.  5.  Claritin 10 mg once a day p.r.n.  6.  Aspirin 81 mg once a day.  7.  Metoprolol 25 mg 1/2 tablet b.i.d.  8.  Diltiazem extended release 240 mg 1 capsule daily.  9.  Omeprazole 40 mg once a day.  10. Levofloxacin 500 mg once a day for 5 days.   DISCHARGE DIET: Low-sodium.    DISCHARGE DIET TYPE: Mechanical soft with thin liquids.   DISCHARGE DIET INSTRUCTIONS: No straws. Strict aspiration precautions. Moisten food well. Magic cup ice cream at lunch and dinner. The  patient must sit fully upright with all oral intake. Tray set up and feeding at all meals. Allow the patient to start feeding himself at meals, but finish feeding the patient the meal due to his quick fatigue and the upper extremity shakiness.   ACTIVITY: As tolerated.   CODE STATUS: The patient is DNR.   DISCHARGE FOLLOWUP: The patient will follow up with Dr. Sampson GoonFitzgerald in 1 to 2 weeks.   DISCHARGE DURATION: This discharge took 35 minutes.  ____________________________ Stann Mainlandavid P. Sampson GoonFitzgerald, MD dpf:aw D: 07/04/2012 08:26:01 ET T: 07/04/2012 08:57:35 ET JOB#: [id number]  cc: Stann Mainlandavid P. Sampson GoonFitzgerald, MD, <Dictator> Kaelyn Nauta Sampson GoonFITZGERALD MD ELECTRONICALLY SIGNED 07/04/2012 23:28

## 2014-07-18 NOTE — Consult Note (Signed)
Chief Complaint:  Subjective/Chief Complaint Speech evaluation appreciated. Doubt that aspiration is secondary to esophageal disorder as only a mild esophageal stricture was seen on recent EGD nad was dilated. Agree with mechanical soft diet. No further recommendations. Will sign off. Please call us if needed. Thanks.   Electronic Signatures: Lurline DelIftikhar, Avielle Imbert (MD)  (Signed 07-Apr-14 18:20)  Authored: Chief Complaint   Last Updated: 07-Apr-14 18:20 by Lurline DelIftikhar, Karmelo Bass (MD)

## 2014-07-18 NOTE — Consult Note (Signed)
PATIENT NAME:  Joel Lawson, Joel Lawson MR#:  [id number] DATE OF BIRTH:  1917-08-20  DATE OF CONSULTATION:  07/02/2012  CONSULTING PHYSICIAN:  Lurline DelShaukat Anely Spiewak, MD  REASON FOR CONSULTATION: Dysphagia and aspiration pneumonia.   HISTORY OF PRESENT ILLNESS: The patient is a 81 year old male with a history of hypertension, hyperlipidemia, sleep apnea.  The patient was admitted with probable aspiration pneumonia. The patient is a very poor historian, and very little history is available from the patient. Apparently, for quite some time he has been having trouble with choking and coughing during meals with both solids and liquids. A few months ago, the patient underwent an upper GI endoscopy. Barrett's esophagus was noted as well as a mild distal esophageal stricture which was dilated to 54-French using a Maloney dilator by Dr. Bluford Kaufmannh. The patient denies any other significant GI issues such as nausea, vomiting, abdominal pain, etc.   PAST MEDICAL HISTORY: Significant for hypertension, depression, osteoarthritis, sleep apnea, history of mild esophageal stricture as mentioned above, history of herpes zoster, hyperlipidemia, spinal stenosis.   ALLERGIES: PENICILLIN.   SOCIAL HISTORY: He is a resident of [place]. He has a 30 pack-year history of smoking, quit about 30 years ago.   FAMILY HISTORY: Unremarkable.   HOME MEDICATIONS: Include Aleve, diltiazem, docusate sodium, loratadine, metoprolol, omeprazole, Remeron, Senna, sertraline, multivitamin, Tylenol and vitamin D3.   REVIEW OF SYSTEMS: Negative except for what is mentioned in the history of present illness.   PHYSICAL EXAMINATION: GENERAL: Elderly, pretty lethargic-appearing male, does not appear to be in any acute distress.  VITAL SIGNS: Temperature 98.2, pulse 85, respirations 16, blood pressure 126/74.  SKIN: Somewhat pale.  NECK: Veins are flat.  LUNGS: Clear to auscultation.  CARDIOVASCULAR: Regular rate and rhythm. Normal S1, S2. A 2/6 systolic  murmur.  ABDOMEN: Quite soft and benign. Bowel sounds positive. Nontender, nondistended. No rebound or guarding was noted.   LABORATORY AND RADIOLOGICAL DATA:  White cell count 11.5, hemoglobin 10.6, hematocrit 32.6, platelet count of 423. Electrolytes are fairly unremarkable. Creatinine is 1.36. Urinalysis is unremarkable.   CT scan of the head without acute findings.   ASSESSMENT AND PLAN: The patient is with acute mental status changes and what appears to be pneumonia which is suspicious for aspiration pneumonia. The patient is giving history of coughing, choking and dysphagia with both solids and liquids. Based on his overall physical appearance, this appears to be dysphagia of neuromuscular origin. EGD a few months ago was quite unremarkable except for a mild esophageal stricture that was dilated, and I doubt that esophageal stricture has anything to do with the patient's aspiration and aspiration pneumonia. The patient has Barrett's esophagus which would not contribute to aspiration or dysphagia. I agree with the speech therapy evaluation. The patient was evaluated by a speech therapist, and a mechanical soft diet has been recommended. No further recommendations from the GI standpoint. The patient can follow with Dr. Lutricia FeilPaul Oh as outpatient, although if the patient continues to have significant dysphagia while in the hospital, GI can be reconsulted. As mentioned above, the dysphagia appears to be neuromuscular in origin, and there are no further GI recommendations. At this point we will sign off. Please do not hesitate to call me if we can be of any further assistance.   ____________________________ Lurline DelShaukat Analiz Tvedt, MD si:cb D: 07/02/2012 18:04:48 ET T: 07/02/2012 20:26:52 ET JOB#: [id number]  cc: Lurline DelShaukat Janiyla Long, MD, <Dictator> Lurline DelSHAUKAT Donevan Biller MD ELECTRONICALLY SIGNED 07/03/2012 15:30

## 2014-07-18 NOTE — Discharge Summary (Signed)
PATIENT NAME:  Joel Lawson, Joel Lawson MR#:  [id number] DATE OF BIRTH:  1917/10/21  DATE OF ADMISSION:  05/15/2012 DATE OF DISCHARGE:  05/17/2012  PRIMARY CARE PHYSICIAN: Clydie Braunavid Briena Swingler, MD  DISCHARGE DIAGNOSES: 1. Encephalopathy.  2. Ataxia and falls.  3. Depression.  4. Renal insufficiency.   HISTORY OF PRESENT ILLNESS: Please see admission history and physical. Briefly, this patient was having increasing confusion and fall at home. He lives in [place]. He was admitted after being unable to walk and had lower extremity weakness and bruising.   HOSPITAL COURSE:  1. Altered mental status.  This improved with IV fluids and antibiotics.  2. Ataxia. He was seen by physical therapy. He likely has some spinal stenosis. He was treated with prednisone taper. Physical therapy is recommended at skilled nursing facility.  3. ID.  The patient likely has evidence of a pneumonia with an elevated white count and has been treated as an outpatient with levofloxacin. Continue levofloxacin course as an outpatient.  4. Renal insufficiency. The patient's creatinine stabilized.  DISCHARGE MEDICATIONS: 1  simvastatin.  2. Sertraline 50 mg once a day.  3. Omeprazole 40 mg once a day.  4. Vitamin D 1000 once a day.  5. Bayer aspirin 81 mg once a day.  6. Multivitamin once a day.   320 q. 8 hours.  8. Levofloxacin 250 mg once a day. Finish course of 10 days.  9. Prednisone 20 mg 3 tablets a day for 1 day, then 2 tablets a day for 3 days, then 1 tablet a day for 3 days.    DISCHARGE DIET: Regular consistency, low sodium, low fat.  CODE STATUS: The patient is DNR. DNR form is placed on the chart.   DISCHARGE FOLLOWUP: With Dr. Sampson GoonFitzgerald in 1 to 2 weeks.  DISCHARGE TIME: 35 minutes. ____________________________ Stann Mainlandavid P. Sampson GoonFitzgerald, MD dpf:sb D: 05/17/2012 08:39:37 ET T: 05/17/2012 08:57:56 ET JOB#: [id number]  cc: Stann Mainlandavid P. Sampson GoonFitzgerald, MD, <Dictator> Vanetta Rule Sampson GoonFITZGERALD MD ELECTRONICALLY SIGNED 05/30/2012  19:25

## 2014-07-18 NOTE — Discharge Summary (Signed)
PATIENT NAME:  Joel Lawson, Joel Lawson MR#:  [id number] DATE OF BIRTH:  04-10-1917  DATE OF ADMISSION:  05/30/2012 DATE OF DISCHARGE:  06/01/2012  DISCHARGE DIAGNOSES: 1.  Atrial flutter with rapid ventricular response.  2.  Chest pain, negative enzymes.  3.  Depression.  4.  Debility.  5.  Acute renal failure, resolved.   HISTORY OF PRESENT ILLNESS:  Please see admission history and physical from March 5th.  Briefly, this is a 81 year old gentleman who has been having increasing debility and has moved from an assisted living into a skilled nursing facility.  He was readmitted March 5th after a recent admission after he was found to have tachycardia and be in atrial flutter.  He had only recently been discharged February 20th after a brief admission for increasing confusion, ataxia and possible pneumonia.   HOSPITAL COURSE BY ISSUE:  1.  Atrial flutter.  The patient remained stable, but did have a few episodes where his heart rate would go up to 130 when he would get up to use the toilet.  He had minimal symptoms, but did have a little bit of dizziness with it.  He was treated with diltiazem 240 mg once a day as well as metoprolol 12.5 twice a day.  He mainly converted back to sinus rhythm with only a few episodes of atrial flutter.  The plan is to continue him on these meds and his aspirin.  They can be up-titrated as needed.  His blood pressure was elevated, but I think he will tolerate these medications.  2.  Depression.  The patient is quite depressed, has a flat affect.  I did ask psych to see him, but there were no further recommendations.  He remains on the Remeron and Zoloft.  He had been losing weight which is why the Remeron had been added at his last visit.  3.  Chest pain.  His troponins were negative and he had no further chest pain.   4.  Debility.  He needs to return to skilled nursing facility to work with physical therapy.  5.  Acute renal failure.  On admission patient's creatinine was  elevated at 1.6.  With IV fluids it came down to 1.36.  Of note, the patient did have mildly persistently elevated white count of 12.9, but urinalysis and chest x-ray were negative.  The patient had no fevers throughout his hospitalization.    DISCHARGE LABORATORY DATA:  On March 6th, BUN was 28, creatinine 1.36, potassium 4.7.  The rest of the electrolytes are normal.  LFTs were within normal limits except albumin of 2.2.  Troponins were negative.  White blood count 12.9, hemoglobin 11.5, platelets were 60 with INR 1.1.  Urinalysis negative.  CT scan of the head is negative for any acute pathology.   DISCHARGE MEDICATIONS: 1.  Sertraline 50 mg once a day.  2.  Omeprazole 40 once a day.  3.  Vitamin D3 once a day.  4.  Aspirin 81 mg once a day.  5.  Aleve 220 1 tablet every 8 hours as needed for pain.  6.  Remeron 15 mg at bedtime.  7.  Tylenol 325 2 tablets every 4 hours as needed.  8.  Metoprolol 12.5 mg 2 times a day.  9.  Diltiazem 240 1 capsule once a day.  10.  Senna 1 capsule twice a day as needed for constipation.  11.  Colace 100 mg 1 capsule twice a day as needed.   HOME OXYGEN:  None.  DISCHARGE DIET:  Low sodium, regular consistency.   ACTIVITY:  As tolerated.   CODE STATUS:  THE PATIENT IS DO NOT RESUSCITATE.  FOLLOW-UP:  The patient needs to follow up within 1 to 2 weeks with Dr. Sampson GoonFitzgerald.    ISSUES TO ADDRESS AT FOLLOW-UP:  The patient will need to have evaluation of his heart rate and blood pressure and adjust the metoprolol, diltiazem as needed.  We will also need repeat  bmet  and CBC to evaluate his mild acute renal failure on admission and his mildly elevated white count.   TIME SPENT:  This discharge took 40 minutes.     ____________________________ Stann Mainlandavid P. Sampson GoonFitzgerald, MD dpf:ea D: 06/01/2012 21:53:58 ET T: 06/01/2012 22:40:23 ET JOB#: [id number]  cc: Stann Mainlandavid P. Sampson GoonFitzgerald, MD, <Dictator> DAVID Sampson GoonFITZGERALD MD ELECTRONICALLY SIGNED 06/03/2012 11:46

## 2014-07-18 NOTE — Consult Note (Signed)
PATIENT NAME:  Joel Lawson, Joel Lawson MR#:  [id number] DATE OF BIRTH:  [birth year]  DATE OF CONSULTATION:  05/31/2012  REFERRING PHYSICIAN:  Stann Mainland. Sampson Goon, MD CONSULTING PHYSICIAN:  Ardeen Fillers. Garnetta Buddy, MD  REASON FOR CONSULTATION:  Depression.   HISTORY OF PRESENT ILLNESS:  The patient is a 81 year old white male with long history of hypertension and depression who was admitted due to new onset atrial flutter. The patient was recently discharged from the hospital to the [facility] on February 20th by Dr. Sampson Goon, but he started having symptoms again. He came to the hospital because he had a fall with a head injury and was having some pain behind his eyes and headache. The patient reported that he came to the hospital and had a CT scan done. The patient reported that he was feeling weak for the last few days and was not able to do his activities of daily living. He was also complaining of dry cough and substernal chest pressure along with fluctuating blood pressure for the past few days. He was found to have new onset atrial flutter with a rate of 106 per minute. He was admitted for further evaluation and management and was found to be severely depressed during this admission.   During my interview, the patient reported that he lives by himself in [place] for the past 14 to 15 years. He stated that he has been following Dr. Sampson Goon on an outpatient basis who has started him on the combination of Zoloft and Remeron. He has been compliant with his medications, but he does not feel that the medications have been helpful. He reported that he spends time walking around in the [facility]. He stated that he does not want to change any of his medications at this time and does not want to see a psychiatrist. He reported that he used to see a psychiatrist in the past and has seen a physician in the [facility], but he thinks that the psychiatrist charged too much. The patient reported that he has a good rapport  with Dr. Sampson Goon and he will discuss his symptoms with him. The patient reported that he came here because of the problems with his heart and he was feeling dizzy, but now he has started improving. He currently denied having any suicidal ideation or plans. He reported that he is not having any paranoia. He reported that he is gradually improving on his medications.   PAST PSYCHIATRIC HISTORY:  The patient reported a long history of severe depression. He reported that he received ECT treatment when he was in [place] almost 50 to 60 years ago. He reported that he does not remember how many treatments he received at that time. The patient stated that he improved after the ECT treatment. He does not remember the name of the medications which were prescribed around that time. He stated that he is not willing to see any psychiatrist on an outpatient basis.   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   PAST SURGICAL HISTORY:  Tonsillectomy, bilateral cataract replacement and left carotid surgery in 2005.   ALLERGIES:  PENICILLIN.   SOCIAL HISTORY:  The patient reported that he widowed and lives by himself in [place]. He stated that his daughter works at [employer] in [place]. She lives in [place]. He has a good  relationship with her.   FAMILY HISTORY:  The patient has a family history of cardiac conduction deficit.   CURRENT MEDICATIONS:  Sertraline 50 mg daily, Remeron 15 mg at bedtime, vitamin D3 at 1000 International Units per day, omeprazole 40 mg daily, aspirin 81 mg daily, Aleve 220 mg q. 8 hours p.r.n. as needed.   REVIEW OF SYSTEMS:   CONSTITUTIONAL: Positive for fatigue and weakness.  EYES: No double or blurred vision.  ENT: No tinnitus or ear pain.  RESPIRATORY: Denies any  wheezing or hemoptysis. Positive for dry cough.  CARDIOVASCULAR: Substernal chest pressure positive.  GASTROINTESTINAL: No nausea, vomiting or diarrhea.  GENITOURINARY: No dysuria or hematuria.  ENDOCRINE: No polyuria, nocturia.  HEMATOLOGY: No anemia or easy bruising.  SKIN: No rash or lesion.  MUSCULOSKELETAL: History of osteoarthritis and spinal stenosis.  NEUROLOGIC: No tingling or numbness, positive for weakness.   PHYSICAL EXAMINATION: VITAL SIGNS: Temperature 99, pulse 96, respiration 22, blood pressure 118/62.   LABORATORY DATA:  Glucose is 97, BUN 28, creatinine 1.36, sodium 139, potassium 4.7, chloride 107, bicarbonate 25, anion gap 7, magnesium 2.1, protein 6.5, albumin 2.2, AST 29 and ALT 21. Troponin is 0.02. WBC is 12.9, RBC 3.93, hemoglobin 11.5, hematocrit 35.3, platelet count 460, RDW 15.2, neutrophils 9.4, monocytes 1.2. Prothrombin is 14.2 and INR is 1.1.   MENTAL STATUS EXAMINATION:  The patient is a moderately built male who was lying in the bed. He maintained fair eye contact. His speech was low in tone and volume. Mood was depressed and affect was blunted. Thought process was logical and goal-directed. Thought content was non-delusional. He currently denies having any suicidal ideation or plan. His memory was good and intact. He demonstrated fair insight and judgment. No perceptual disturbances were noted.   DIAGNOSTIC IMPRESSION: AXIS I: Major depressive disorder, recurrent, moderate.  AXIS II: Personality disorder, not otherwise specified.  AXIS III: Please review the medical history.   TREATMENT PLAN:   1.  I discussed with the patient at length about the medication, treatment risks, benefits and alternatives.  2.  He does not want to have his medications adjusted at this time and reported that he will go back with Dr. Sampson GoonFitzgerald and discuss with him about his psychotropic medications. He stated that he also does not want to follow up with psychiatry as he thinks  that the psychiatrist charged too much. He will decide about that later. I have left my information with the patient and advised him that he can think about it and if he is willing, he can follow up as an outpatient. The patient reported that he will not make any promise about the same. He reported that he is feeling good with Dr. Sampson GoonFitzgerald at this time. We will not make any changes with his psychotropic medication at this time.   Thank you for allowing me to participate in the care of this patient.   ____________________________ Ardeen FillersUzma S. Garnetta BuddyFaheem, MD usf:si D: 05/31/2012 16:49:00 ET T: 05/31/2012 18:16:47 ET JOB#: [id number]  cc: Ardeen FillersUzma S. Garnetta BuddyFaheem, MD, <Dictator> Rhunette CroftUZMA S FAHEEM MD ELECTRONICALLY SIGNED 06/04/2012 21:32

## 2014-07-18 NOTE — H&P (Signed)
PATIENT NAME:  BURRELL, HODAPP MR#:  [id number] DATE OF BIRTH:  [birth year]  DATE OF ADMISSION:  05/30/2012  PRIMARY CARE PHYSICIAN: Dr. Clydie Braun  REQUESTING PHYSICIAN: Dr. Mindi Junker   CHIEF COMPLAINT: Chest pressure, tachycardia and atrial flutter.   HISTORY OF PRESENT ILLNESS: The patient is a 81 year old male with a known history of hypertension, hiatal hernia, depression, is being admitted for new onset atrial flutter. The patient was recently discharged from the hospital to [facility] on February 20th by Dr. Sampson Goon. He was doing okay until about a week ago when he had a fall, had a head injury, and has been having some pain behind his eyes and headache. The patient claims that he was here in the Emergency Department and had a CT scan and all kind of work-up, although I do not see any CT for the last week. I do see a CT scan reported on February 18th which was when he was here in the hospital. The patient has been feeling weak for the last few days and has not been able to do his daily living activities. He has also been having some dry cough and started having some substernal chest pressure along with fluctuating blood pressure for which he was requested to come to the Emergency Department. While in the ED, he was found to be in new onset atrial flutter with a rate of 106 per minute. He is being admitted for further evaluation and management.  While in the ED, his heart rate was as fast as up to 130. He was given aspirin.  He is feeling better and is being admitted for further evaluation and management.   PAST MEDICAL HISTORY:  1. Hypertension.  2. Depression.  3. Osteoarthritis.  4. Sleep apnea.  5. Hiatal hernia with a history of Barrett's.  6. History of TIA with left hemiparesis and speech defect, now resolved.  7. History of herpes zoster.  8. Hyperlipidemia. 9. Spinal stenosis.     PAST SURGICAL HISTORY:  1. Tonsillectomy.  2. Bilateral cataract replacement and left  carotid surgery in 2005 by Dr. Michela Pitcher.   ALLERGIES: PENICILLIN.   SOCIAL HISTORY: He lives at [facility].  He is a widow. He has a 30 pack-year smoking history, quit about 30 years ago. No alcohol history.   FAMILY HISTORY: Father had a pacemaker due to cardiac conduction abnormality.   MEDICATIONS AT HOME: 1. Aleve 220 mg p.o. every 8 hours as needed.  2. Aspirin 81 mg p.o. daily. 3. Omeprazole 40 mg p.o. daily. 4. Remeron 15 mg p.o. at bedtime.  5. Sertraline 50 mg p.o. daily.  6. Vitamin D3 1000 international units once daily.   REVIEW OF SYSTEMS:  CONSTITUTIONAL: No fever. Positive fatigue and weakness.  EYES: No blurred or double vision.  ENT: No tinnitus or ear pain.  RESPIRATORY: No wheezing or hemoptysis. Positive for dry cough.  CARDIOVASCULAR: Substernal chest pressure present.  New onset atrial flutter on monitor.  GASTROINTESTINAL: No nausea, vomiting, diarrhea.  GENITOURINARY: No dysuria or hematuria.  ENDOCRINE: No polyuria or nocturia.  HEMATOLOGY: No anemia or easy bruising.  SKIN: No rash or lesion.  MUSCULOSKELETAL: History of osteoarthritis and spinal stenosis.  NEUROLOGICAL: No tingling or numbness. Positive for weakness.  PSYCHIATRIC: No history of anxiety. Positive for depression.   PHYSICAL EXAMINATION: VITAL SIGNS: Temperature 99, heart rate 130 per minute, respirations 17 per minute, blood pressure 122/78 mmHg. He was saturating 96% on room air.  GENERAL: The patient is a 81 year old male  lying in the bed comfortably without any acute distress.  HEENT: Eyes: Pupils are equal, round, reactive to light and accommodation. No scleral icterus. Extraocular muscles are intact. Head: He does have contusions with some erythema around. No signs of infection. Normocephalic. Oropharynx and nasopharynx dry and clear.  NECK: Supple. No jugular venous distention. No thyroid enlargement or tenderness.  LUNGS: Clear to auscultation bilaterally. No wheezing, rales, rhonchi, or  crepitation.  CARDIOVASCULAR: Irregularly irregular heart sounds. No murmurs, rubs, or gallops.  ABDOMEN: Soft, nontender, nondistended. Bowel sounds present. No organomegaly or mass.  EXTREMITIES: No pedal edema, cyanosis or clubbing.  NEUROLOGICAL: Decreased strength in his leg. He does have mild sensory neuropathy to light touch all the way from his feet to his knees, not able to feel anything. Muscle strength otherwise intact. Cranial nerves II through XII are intact.  PSYCHIATRIC: The patient is oriented to time, place, and person x 3.  SKIN: No obvious rash, lesion, or ulcer except some erythema on  forehead.   LABORATORY AND RADIOLOGICAL DATA:  Normal BMP except BUN of 33, creatinine 1.61. Normal liver function tests. Normal first set of cardiac enzymes. CBC showed white count of 13.2, hemoglobin 12.1, hematocrit 37.4, platelets 502. Normal coagulation panel with INR 1.1. UA was negative.   Chest x-ray while in the ED showed bilateral diffuse interstitial thickening, possible edema versus interstitial pneumonitis.  EKG shows atrial flutter with variable  block.  No ST-T changes.   IMPRESSION AND PLAN: 1. New onset atrial flutter: We will rule him out with serial troponins. Monitor him on telemetry. Obtain 2-D echo. Consult Cardiology. Continue aspirin, change it to 325 mg.  2. Chest pressure: Could be from hiatal hernia and GERD but cannot rule out cardiac etiology at this time. We will do serial troponins to rule out MI.  3. Hyperlipidemia: We will continue statin. Check fasting lipid profile.  4. Weakness, likely multifactorial: We will check TSH.  We will start him on IV hydration.  5. Chronic kidney disease, stage II to III, with a baseline creatinine of 1.6: We will monitor his renal function very closely.   CODE STATUS: DO NOT RESUSCITATE.     TOTAL TIME TAKING CARE OF THIS PATIENT: 55 minutes.   ____________________________ Ellamae SiaVipul S. Sherryll BurgerShah, MD vss:cb D: 05/30/2012 17:15:31  ET T: 05/30/2012 17:42:59 ET JOB#: [id number]  cc: Vipul S. Sherryll BurgerShah, MD, <Dictator> Stann Mainlandavid P. Sampson GoonFitzgerald, MD Karie Schwalbeichard I. [place], MD Ellamae SiaVIPUL S [facility] MD ELECTRONICALLY SIGNED 05/31/2012 15:45

## 2014-07-18 NOTE — H&P (Signed)
PATIENT NAME:  Joel Lawson, Jance MR#:  [id number] DATE OF BIRTH:  Mar 18, 1918  DATE OF ADMISSION:  06/30/2012  PRIMARY CARE PHYSICIAN: Stann Mainlandavid P. Sampson GoonFitzgerald, MD  EMERGENCY DEPARTMENT REFERRING PHYSICIAN: Su Leyobert L. Kinner, MD  ADMITTING PHYSICIAN: Stephanie AcreVishal Ranesha Val, MD  CHIEF COMPLAINT: "Not interactive."   HISTORY OF PRESENT ILLNESS: This is a 81 year old Caucasian male with past medical history of hypertension, depression, osteoarthritis, esophageal dilation, esophageal stricture, Barrett's esophagus, GERD, sleep apnea, TIA with residual left hemiparesis and previous speech defect that was formally resolved, recent admission for chest pressure or tachycardia, found to have atrial flutter, discharged in 1 day. Sent to the ED by the [facility] after not being interactive and speaking this morning. The patient's daughter is at the bedside and provided most of the history, stated that the patient was here 2-1/2 weeks ago for admission of chest pain that he complained of, found to have atrial tachycardia/atrial flutter. At that time, his heart rate was noted to be up into the 130s with exertion. He was treated with diltiazem 240 mg once a day and it resolved. Today the daughter was called by [facility] because she said that the nursing staff told her that the patient was not interacting anymore. At discharge, he went to rehab for weakness and physical therapy, which he had been interacting in. However, it is also noted that the patient was not eating and drinking as much as he normally does. He continued to get weak and over the past couple days, the daughter said that he is not able to effectively clearing his secretions or take food effectively. Today this morning, he was noted to not be interacting much or talking to the nursing staff and he was sent to the ED for further evaluation. In the ED, he was noted to have a reactive thrombocytosis with elevated white cell count. His chest x-ray  showed bilateral infiltrates, right greater than left. Hospitalist services were consulted for further inpatient workup and management.   PAST MEDICAL HISTORY: Hypertension, depression, osteoarthritis, sleep apnea, hiatal hernia with a history of Barrett's esophagus, status post esophageal stricture, status post esophageal dilation by Dr. Bluford Kaufmannh in July 2013, history of herpes zoster, hyperlipidemia, spinal stenosis, history of Barrett's esophagus.   PAST SURGICAL HISTORY: Tonsillectomy, bilateral cataract replacement and left carotid surgery in 2005 by Dr. Michela PitcherEly.   ALLERGIES: PENICILLIN.   SOCIAL HISTORY: Currently a resident of [facility]. Daughter is the healthcare power of attorney. He has a 30 pack-year history of smoking, quit about 30 years ago. No alcohol history.   FAMILY HISTORY: Per records, father had a pacemaker due to cardiac conduction abnormality.   MEDICATION LIST: From the nursing home is as follows: Aleve sodium 220 mg take 1 tab every 8 hours as needed for pain, aspirin 81 mg take once a day, diltiazem 240 mg every 24 hours take 1 capsule once a day, docusate sodium 100 mg 1 capsule b.i.d. as needed for constipation, loratadine 10 mg 1 tab once a day, metoprolol tartrate 25 mg 1/2 tab b.i.d., omeprazole 40 mg q. morning, Remeron 50 mg at bedtime, senna 1 tab p.o. b.i.d. as needed for consultation, sertraline 50 mg 1 tab once a day, multivitamin 1 tab once a day, Tylenol 325 mg 2 tabs every 4 hours as needed for pain and fever, vitamin D3 one tab once a day.   REVIEW OF SYSTEMS: Unable to obtain since the patient is currently not speaking. Daughter was able to provide  a brief history with no fever but positive weakness. No nausea, vomiting, diarrhea, constipation. Decreased interaction, decreased appetite and inability to effectively clear secretions and was pocketing his food.   PHYSICAL EXAMINATION:  VITAL SIGNS: In the ED are as follows: Temperature 98.1, blood pressure 142/75,  respirations 18, pulse 61, weight 77.1 kg, BMI 25.1. GENERAL: The patient is a 81 year old male lying in bed comfortably without any acute respiratory distress, well-developed, well-nourished male.  HEENT: Pupils equal, round and reactive to light and accommodation. No scleral icterus. Extraocular muscles are intact. Head: He does not have any lacerations or lesions. No signs of infection. Normocephalic. Oropharynx and nasopharynx are dry. Mucous membranes are dry.  NECK: Supple, nontender. No jugular venous distention. No thyroid enlargement or tenderness.  LUNGS: Clear to auscultation bilaterally. No wheezes, rhonchi, rales or crackles.  CARDIOVASCULAR: Irregularly irregular heart sounds. No murmurs, rubs or gallops. No pedal edema is noted also.  ABDOMEN: Soft, nontender, nondistended. Bowel sounds are present. No organomegaly or mass.  EXTREMITIES: No pedal edema, cyanosis or clubbing.  NEUROLOGICAL: Decreased strength in bilateral upper and lower extremities, 3/5. Follows simple commands. Not verbalizing any wants or needs. Cranial nerves II through XII are mostly intact.  PSYCHIATRIC: The patient is alert but not oriented to person, time or place at this time. Judgment is poor.  SKIN: No obvious rash, lesions.   PERTINENT LABORATORY DATA: Sodium 137, potassium 3.5, chloride 101, bicarbonate 31, BUN 20, creatinine 1.36, glucose 109, albumin 2.2. AST/ALT of 108/90. Thyroid-stimulating hormone is 0.634. White cell count 12.2, hemoglobin 13, hematocrit 40, platelets 546, RDW 15.8. He did have a urinalysis done that showed essentially negative. He did have 30 mg/dL on the urinalysis.  RADIOGRAPHIC EXAMINATIONS: Chest x-ray portable showed findings worrisome for bibasilar atelectasis or developing pneumonia. Followup PA and lateral chest x-ray will be useful when the patient can tolerate the procedure. There are increased interstitial markings, predominantly on the right. The cardiac silhouette is  obscured by hemidiaphragms. The pulmonary vascularity is not clearly engorged. He did have a CT of the head without contrast that showed mild age-related atrophic changes diffusely. There is no evidence of an acute ischemic or hemorrhagic infarction. There is no intracranial mass effect.   DIAGNOSTIC DATA: EKG showed atrial fibrillation, rate of about 70, no acute ST-T wave changes.   ASSESSMENT AND PLAN: A 81 year old male with past medical history of hypertension, hyperlipidemia, atrial fibrillation/flutter, sleep apnea, Barrett's esophagus, esophageal stricture, status post dilation, now with altered mental status and being admitted for pneumonia.  1.  Pneumonia: Most likely aspiration pneumonia given the findings that are seen clinically and on the chest x-ray. Predominantly right greater than left basilar infiltrates are noted. Will treat his aspiration pneumonia for now with vancomycin and Zosyn. HE HAS A REMOTE PENICILLIN ALLERGY. Will challenge him with Zosyn and renally dose his vancomycin and Zosyn as antibiotics. He is noted to have dry mucous membranes and tenting of the skin. Will hydrate him with gentle fluids at 75 mL/h, put him on a pureed diet and get a speech evaluation to assess his level of dysphagia. Would also check blood and urine cultures. Daughter at the bedside who stated that the patient is a DO NOT RESUSCITATE.  2.  Altered mental status: Most likely related to #1. Will treat as stated above.  3.  Esophageal stricture, history of Barrett's and hiatal hernia: This could be mainly  the cause of his aspiration. He was last dilated by Dr. Bluford Kaufmann in July  2013. He is on a chronic proton pump inhibitor, which I will continue at this time. I have spoken with Dr. Niel Hummer, who is covering the services this weekend, who stated that he will see the patient tomorrow. If the patient is unable to tolerate a procedure such as another dilation, or family does not want that done or patient does not  want that done, we may consider addressing the topic of a feeding tube to reduce the amount of aspirations that he could potentially have in the future.  3.  Hypertension: Continue with home medications. 4.  Chronic renal failure: His creatinine is currently at baseline to where it was prior to discharge, last time 1.36. Stage II to III kidney disease as noted. Will continue with gentle intravenous fluids at this time.   CODE STATUS: The patient is a DNR/DNI.  TIME SPENT: 45 minutes.   ____________________________ Stephanie Acre, MD vm:jm D: 06/30/2012 17:43:04 ET T: 06/30/2012 19:02:24 ET JOB#: [id number]  cc: Stephanie Acre, MD, <Dictator> Stephanie Acre MD ELECTRONICALLY SIGNED 06/30/2012 21:03

## 2014-07-20 NOTE — Discharge Summary (Signed)
PATIENT NAME:  Joel Lawson, Joel Lawson MR#:  [id number] DATE OF BIRTH:  01/02/1918  DATE OF ADMISSION:  09/29/2011 DATE OF DISCHARGE:  10/02/2011  PRIMARY CARE PHYSICIAN:  Conchita Parison Chaplin, M.D.   DISCHARGE DIAGNOSES:  1. Lower gastrointestinal bleed, likely attributable to diverticulosis. 2. Gastroesophageal reflux disease.  3. Depression.  4. Hyperlipidemia.  5. History of cerebrovascular accident.  HISTORY OF PRESENT ILLNESS:  81 year old male who resides at [facility], presented to the ED after an episode of black tarry stools and bright red bleeding per rectum. He had shortness of breath and some lower abdominal discomfort. He was found to be hemodynamically stable with initial hemoglobin and hematocrit of 11.5 and 35.7.   HOSPITAL COURSE: The patient was admitted and placed on telemetry as well as IV Protonix drip. Gastroenterology was consulted and CBCs were closely monitored. He was given IV fluids and his initial creatinine had been mildly elevated at 1.49. It improved with fluids to 1.3.  He was seen by Dr. Bluford Kaufmannh, gastroenterology, who recommended upper endoscopy. On hospital day two he underwent upper endoscopy which was notable for benign-appearing, mild stenosis at the GE junction. This was dilated. The remainder of the scope was unremarkable. He had a normal-appearing stomach and duodenum. On hospital day three his hemoglobin and hematocrit did decline somewhat to 9.4/28.3, but on his last hospital day hemoglobin and hematocrit had improved to 10.2/31. He had no further bright red blood per rectum during hospital stay.  He remained hemodynamically stable throughout. He did have ongoing mild pressure in the lower pelvic region. He tolerated his diet and diet initially had been limited, but was advanced.   DISCHARGE MEDICATIONS: There were no changes in his medications, so discharge medications are the same as initial medications, which include: 1. Zoloft 50 mg daily.   2. Simvastatin 40 mg daily.  3. Omeprazole 40 mg daily.   DISCHARGE INSTRUCTIONS:  1. Follow up with Dr. Candelaria Stagershaplin in the next 1 to 2 weeks. Repeat a CBC before that clinic appointment. The patient will be contacted and scheduled for these appointments.  2. Recommend a high fiber diet with avoidance of seeds and nuts given suspicion that GI bleed may be related to diverticulosis.    ____________________________ A. Wendall MolaMelissa Solum, MD ams:bjt D: 10/02/2011 09:57:45 ET T: 10/03/2011 14:06:17 ET JOB#: [id number]  cc: A. Wendall MolaMelissa Solum, MD, <Dictator> Don C. Candelaria Stagershaplin, MD Macy MisA. MELISSA SOLUM MD ELECTRONICALLY SIGNED 10/05/2011 12:47

## 2014-07-20 NOTE — Consult Note (Signed)
Chief Complaint:   Subjective/Chief Complaint Sore throat from EGD yest. No abd pain today. NO BM so far today.   VITAL SIGNS/ANCILLARY NOTES: **Vital Signs.:   06-Jul-13 08:03   Vital Signs Type Routine   Temperature Temperature (F) 98.1   Celsius 36.7   Temperature Source Oral   Pulse Pulse 71   Respirations Respirations 18   Systolic BP Systolic BP 253   Diastolic BP (mmHg) Diastolic BP (mmHg) 68   Mean BP 86   Pulse Ox % Pulse Ox % 96   Oxygen Delivery Room Air/ 21 %   Brief Assessment:   Cardiac Regular    Respiratory clear BS    Gastrointestinal Normal   Lab Results: Routine Chem:  06-Jul-13 04:36    Glucose, Serum 94   BUN  22   Creatinine (comp) 1.30   Sodium, Serum 144   Potassium, Serum 4.4   Chloride, Serum  112   CO2, Serum 26   Calcium (Total), Serum  8.0   Anion Gap  6   Osmolality (calc) 290   eGFR (African American)  55   eGFR (Non-African American)  48 (eGFR values <75m/min/1.73 m2 may be an indication of chronic kidney disease (CKD). Calculated eGFR is useful in patients with stable renal function. The eGFR calculation will not be reliable in acutely ill patients when serum creatinine is changing rapidly. It is not useful in  patients on dialysis. The eGFR calculation may not be applicable to patients at the low and high extremes of body sizes, pregnant women, and vegetarians.)  Routine Hem:  06-Jul-13 04:36    WBC (CBC) 8.9   RBC (CBC)  2.92   Hemoglobin (CBC)  9.4   Hematocrit (CBC)  28.3   Platelet Count (CBC) 201   MCV 97   MCH 32.2   MCHC 33.2   RDW  14.7   Neutrophil % 59.0   Lymphocyte % 25.3   Monocyte % 11.8   Eosinophil % 3.7   Basophil % 0.2   Neutrophil # 5.3   Lymphocyte # 2.3   Monocyte #  1.1   Eosinophil # 0.3   Basophil # 0.0 (Result(s) reported on 01 Oct 2011 at 05:47AM.)   Assessment/Plan:  Assessment/Plan:   Assessment Dysphagia. S/P esophageal dilation. Prob diverticular bleeding. Hopefully, bleeding  will stop on own.    Plan Advance diet. Moniter hgb. Will follow. Thanks.   Electronic Signatures: OVerdie Shire(MD)  (Signed 06-Jul-13 10:37)  Authored: Chief Complaint, VITAL SIGNS/ANCILLARY NOTES, Brief Assessment, Lab Results, Assessment/Plan   Last Updated: 06-Jul-13 10:37 by OVerdie Shire(MD)

## 2014-07-20 NOTE — Consult Note (Signed)
Brief Consult Note: Diagnosis: GI bleed.   Patient was seen by consultant.   Discussed with Attending MD.   Comments: Patient with BRBPR along with some dark material twice since yesterday. Each episode was associated with bowel movement. Mild indigestion and chronic intermittent dysphagia. No hematemesis. EGD in 2009 showed a hiatal hernia and ? stricture which required dilation. Colonoscopy in 2005 showed diverticulosis and hemorrhoids. Hemodynamically stable.  Recommendations: Admit to hospital. IV PPI. Follow H and H. EGD in am by Dr. Bluford Kaufmannh. I will be out of town until Monday. Dr Bluford Kaufmannh will follow over the weekend.  Electronic Signatures: Lurline DelIftikhar, Lewis Grivas (MD)  (Signed 04-Jul-13 13:42)  Authored: Brief Consult Note   Last Updated: 04-Jul-13 13:42 by Lurline DelIftikhar, Montez Stryker (MD)

## 2014-07-20 NOTE — Consult Note (Signed)
PATIENT NAME:  Joel Lawson, Joel Lawson MR#:  [id number] DATE OF BIRTH:  1917/07/24  DATE OF CONSULTATION:  09/29/2011  REFERRING PHYSICIAN:   CONSULTING PHYSICIAN:  Joel DelShaukat Kloey Cazarez, MD  PRIMARY CARE PHYSICIAN: Dr. Conchita Parison Chaplin   REASON FOR CONSULTATION: Melena and hematochezia.   HISTORY OF PRESENT ILLNESS: 81 year old male with history of cerebrovascular accident,  Gastroesophageal reflux disease, questionable peptic ulcer disease, hyperlipidemia. According to the patient, patient came to the Emergency Room complaining of dark and bright red blood per rectum. According to him, he was in his normal state of health until the day before his arrival to ER when he went to the bathroom and saw real dark stools with some red blood. On the morning of the ER visit he noticed some more bright red blood per rectum with some dark granular material. Patient came to the Emergency Room. He was hemodynamically stable. Denies any nausea, vomiting, or hematemesis. Denies any significant abdominal pain.   PAST MEDICAL HISTORY: As above. Patient had a colonoscopy in 2005 which showed diverticulosis, internal hemorrhoids and granularity in the cecum. He had an EGD in 2005 and 2009 which basically showed hiatal hernia and gastritis.   ALLERGIES: Penicillin.   HOME MEDICATIONS:  1. Zoloft. 2. Simvastatin. 3. Omeprazole.   FAMILY HISTORY: Unremarkable.   SOCIAL HISTORY: He is a resident of a local independent living facility.   REVIEW OF SYSTEMS: Grossly negative except for what is mentioned in the history of present illness.    PHYSICAL EXAMINATION:  GENERAL: Well built male, did not appear to be in any acute distress.   VITAL SIGNS: Blood pressure was about 120/70, heart rate is in 90s.   NECK: Neck veins flat.   LUNGS: Grossly clear to auscultation bilaterally with fair air entry.   CARDIOVASCULAR: 2/6 systolic murmur. Regular rate and rhythm.   ABDOMEN: Quite soft and benign, nontender, nondistended. No  rebound or guarding was noted.   EXTREMITIES: No edema. No clubbing or cyanosis.   NEUROLOGIC: Examination appeared to be unremarkable.   LABORATORY, DIAGNOSTIC, AND RADIOLOGICAL DATA: Hemoglobin 11.5, white cell count 9.3, hematocrit 35.7, platelet count 235. Coags were okay. Creatinine 1.40. Liver enzymes were unremarkable.   ASSESSMENT AND PLAN: Patient with some dark stool as well as some red blood in his bowel movements. Patient takes an aspirin a day. Denies history of nonsteroidal use but gives history of peptic ulcer disease. Prior endoscopies do not document peptic ulcer disease, although were consistent with gastritis and a hiatal hernia. Recommendations were made for the patient to be admitted to the hospital on IV Protonix. An upper GI endoscopy was discussed with the patient and he agreed. Patient was scheduled to have an endoscopy with Dr. Lutricia FeilPaul Oh the next day. Recommendations were made to follow his hemoglobin and hematocrit and transfuse if needed. Further recommendations per Dr. Bluford Kaufmannh after upper GI endoscopy.  ____________________________ Joel DelShaukat Creedence Heiss, MD si:cms D: 10/03/2011 10:43:41 ET T: 10/03/2011 13:21:01 ET JOB#: [id number]  cc: Joel DelShaukat Latrel Szymczak, MD, <Dictator> Joel DelSHAUKAT Betrice Wanat MD ELECTRONICALLY SIGNED 10/07/2011 15:08

## 2014-07-20 NOTE — Consult Note (Signed)
Covering for Dr. Niel HummerIftikhar. EGD showed mild GE junction stricture. This was dilated with 54 Fr Maloney. Rest of UGI tract normal. No bleeeding source found. Possible bleeding from diverticulosis. Full liquid diet ordered. Will check back in AM. THanks.  Electronic Signatures: Lutricia Feilh, Carlisle Torgeson (MD)  (Signed on 05-Jul-13 12:46)  Authored  Last Updated: 05-Jul-13 12:46 by Lutricia Feilh, Sheehan Stacey (MD)

## 2014-07-20 NOTE — H&P (Signed)
PATIENT NAME:  Joel Lawson, Joel Lawson MR#:  [id number] DATE OF BIRTH:  [birth year]  DATE OF ADMISSION:  09/29/2011  ER REFERRING PHYSICIAN: Governor Rooksebecca Lord, MD  PRIMARY CARE PHYSICIAN: Conchita Parison Chaplin, MD  CHIEF COMPLAINT: Lower gastrointestinal bleed.   HISTORY OF PRESENT ILLNESS: The patient is a 81 year old Caucasian male with past medical history of cerebrovascular accident, peripheral neuropathy, gastroesophageal reflux disease, depression, and hyperlipidemia who [facility] [address] [facility] [facility]. The patient reported that yesterday afternoon he had one episode of black tarry stools and bright red bleeding per rectum, but he felt well and therefore he did not report it. However, this morning he had another episode therefore decided to come to the Emergency Room. The patient reports he takes a baby aspirin. He reports a history of peptic ulcer disease. I reviewed his old records. In 2005 his EGD showed gastritis and duodenitis. In 2009 his EGD showed gastritis and small hiatal hernia. His last colonoscopy was in 2005 which showed diverticulosis, internal hemorrhoids, and granularity in the cecum. The patient denies having similar symptoms in the past. He reports some shortness of breath. Today he feels some abdominal discomfort. He last saw Dr. Candelaria Stagershaplin about 10 days to a week ago.  ALLERGIES: Penicillin.   PAST MEDICAL HISTORY:  1. History of cerebrovascular accident. 2. Peripheral neuropathy.  3. History of gastroesophageal reflux disease/questionable peptic ulcer disease. 4. Depression.  5. Hyperlipidemia.   PAST SURGICAL HISTORY:  1. Left carotid endarterectomy. 2. Adenoidectomy and tonsillectomy.  3. Cataracts. 4. Colonoscopy and endoscopy.  MEDICATIONS:  1. Zoloft 50 mg daily.  2. Simvastatin 40 mg daily.  3. Omeprazole 40 mg daily.   FAMILY HISTORY: The patient does not know about his mother's medical problems. Father had heart disease and leg amputation.   SOCIAL  HISTORY: He quit smoking more than 30 years ago. He denies alcohol or drug abuse. He lives in [facility].   REVIEW OF SYSTEMS: CONSTITUTIONAL: Denies any fever. Reports fatigue and weakness. EYES: Denies any blurred or double vision. ENT: Denies any tinnitus or ear pain. RESPIRATORY: Denies any cough or wheezing. CARDIOVASCULAR: Denies any chest pain or palpitations. GI: Denies any nausea or vomiting. Has some lower abdominal pain. Reports melena and bright red bleeding per rectum. GU: Denies any dysuria or hematuria. ENDOCRINE: Denies any polyuria or nocturia. HEME/LYMPH: Denies any anemia or easy bruisability. INTEGUMENT: Denies any acne or rash. MUSCULOSKELETAL: Denies any swelling or gout. NEUROLOGICAL: Denies any numbness or weakness. PSYCH: Has history of depression.   PHYSICAL EXAMINATION:   VITAL SIGNS: Temperature 95.6, heart rate 95, respiratory rate 18, blood pressure 120/68, and pulse 100%.   GENERAL: The patient is an elderly Caucasian male lying comfortably in bed, not in acute distress.   HEAD: Atraumatic, normocephalic.   EYES: There is pallor. No icterus or cyanosis. Pupils are equally round and reactive to light and accommodation. Extraocular movements are intact.   ENT: Wet mucous membranes. No oropharyngeal erythema or thrush.   NECK: Supple. No masses. No JVD. No thyromegaly or lymphadenopathy.   CHEST WALL: No tenderness to palpation. Not using accessory muscles of respiration. No intracostal muscle retraction.   LUNGS: Bilaterally clear to auscultation. No wheezing, rales, or rhonchi.   CARDIOVASCULAR: S1 and S2 regular. There is a systolic murmur. No rubs or gallops.   ABDOMEN: Soft and nondistended. No guarding or rigidity. Normal bowel sounds. The patient has some tenderness to palpation in the suprapubic area and the left lower quadrant.  SKIN: No rashes or lesions.   PERIPHERIES: No pedal edema. 1+ pedal pulses.  MUSCULOSKELETAL:  No cyanosis or clubbing.   NEUROLOGICAL: Awake, alert, and oriented x3. Nonfocal neurological examination. Cranial nerves grossly intact.   PSYCH: Normal mood and affect.   RESULTS: White count 9.3, hemoglobin 11.5, hematocrit 35.7, and platelets 235. Glucose 90, BUN 36, creatinine 1.49, sodium 141, potassium 4.2, chloride 102, CO2 28, calcium 8.6, bilirubin 0.5, alkaline phosphatase 77, ALT 19, and AST 28. PT 13.9 and INR 1.0.   ASSESSMENT AND PLAN: A 81 year old male with past medical history of cerebrovascular accident, depression, hyperlipidemia, and gastroesophageal reflux disease who presents with GI bleed.  1. Gastrointestinal bleed. The patient reports both melena/black tarry stools and also bleeding per rectum. He is bleeding per rectum. We will monitor him on telemetry. He is currently on a Protonix drip. We will continue that until he is evaluated by a gastroenterologist. We will obtain a gastroenterology consult and monitor CBC carefully. We will monitor the patient's hemodynamic status carefully. The says he has a history of peptic ulcer disease, however, review of old EGDs done in 2005 and 2009 show only hiatal hernia, gastritis, and duodenitis, and his colonoscopy in 2005 showed evidence of diverticulosis, internal hemorrhoids, and cecal granularity. We will hold the patient's aspirin. 2. Acute renal failure. In the past, the patient's creatinine was normal. This could be related to dehydration/volume loss resulting from lower GI bleed. We will hydrate the patient with IV fluids, avoid nephrotoxic medications, monitor strict ins and outs and recheck in the a.m.  3. History of gastroesophageal reflux disease. The patient is on a Protonix drip.  4. Depression. Continue Zoloft.  5. Hyperlipidemia. Continue statin therapy.   I reviewed all medical records and discussed with the patient the plan of care and management.   TIME SPENT: 75 minutes.  ____________________________ Darrick Meigs, MD sp:slb D: 09/29/2011 12:00:21 ET T: 09/30/2011 08:25:52 ET JOB#: [id number]  cc: Darrick Meigs, MD, <Dictator> [name]. Candelaria Stagers, MD Darrick Meigs MD ELECTRONICALLY SIGNED 09/30/2011 14:16

## 2014-11-25 IMAGING — CT CT HEAD WITHOUT CONTRAST
2 series · 15 of 30 positions shown, 19 images · non-contrast
Comparison: none

REASON FOR EXAM: s/p fall, head injury and having pain behind eyes and
headache
COMMENTS:

PROCEDURE:     CT  - CT HEAD WITHOUT CONTRAST  - May 30, 2012  [DATE]
RESULT:     Comparison:  None
TECHNIQUE: Multiple axial images from the foramen magnum to the vertex were
obtained without IV contrast.

[Series 2: soft tissue · axial · 0.42mm/px · z∈[-166,-36]mm · 13 of 32 slices shown, 17 images]
[im 3/32  brain]
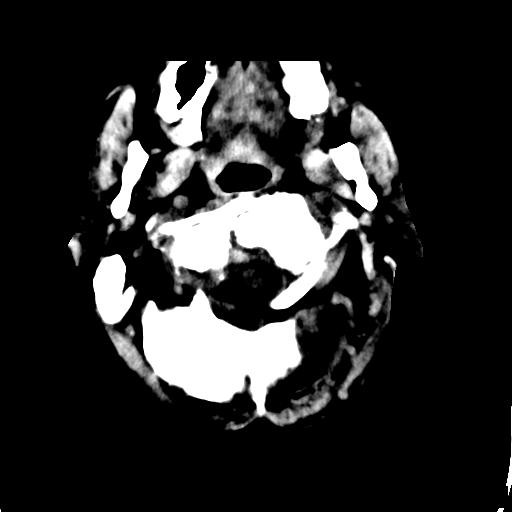
[im 3/32  bone]
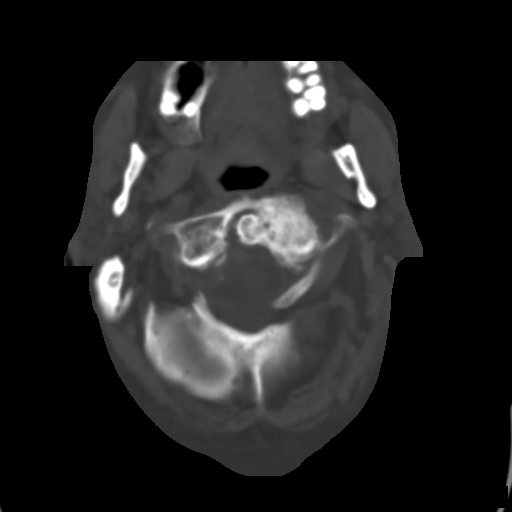
[im 5/32  brain]
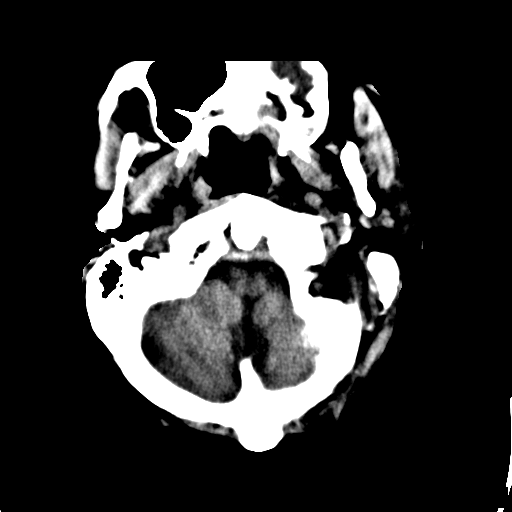
[im 7/32  brain]
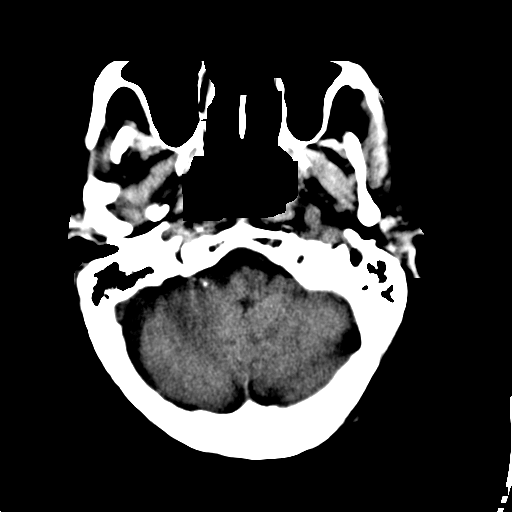
[im 9/32  brain]
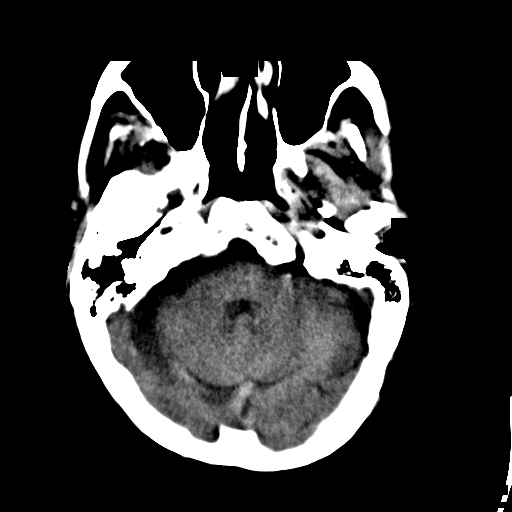
[im 12/32  brain]
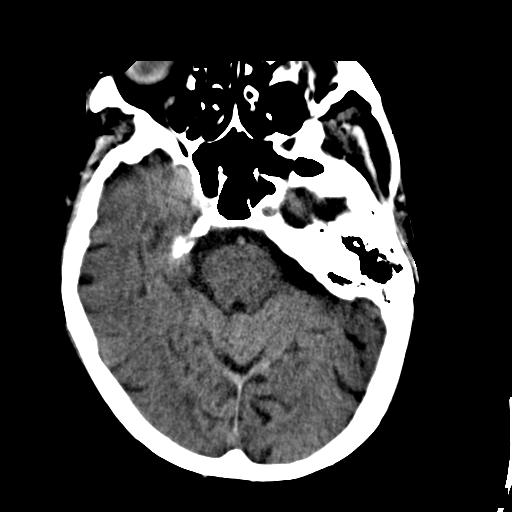
[im 12/32  bone]
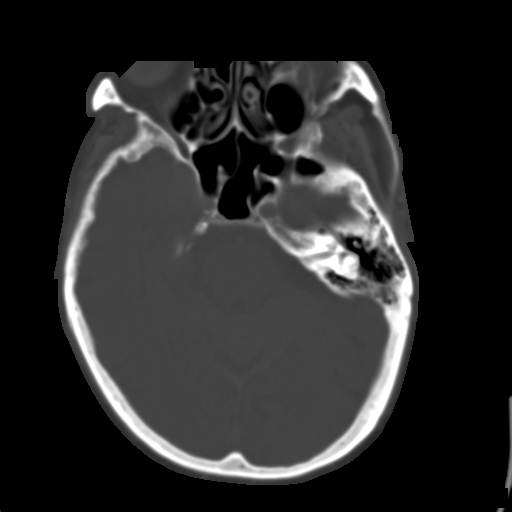
[im 14/32  brain]
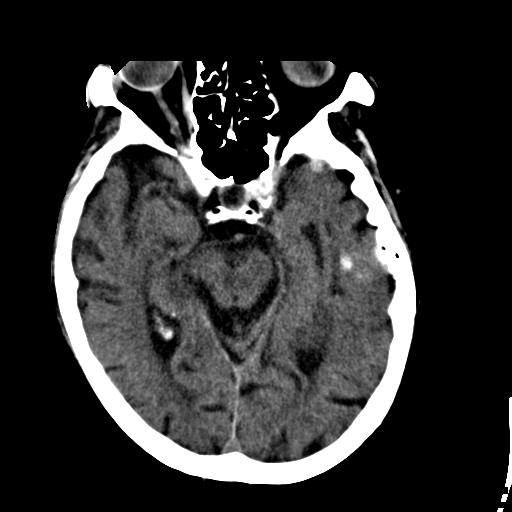
[im 16/32  brain]
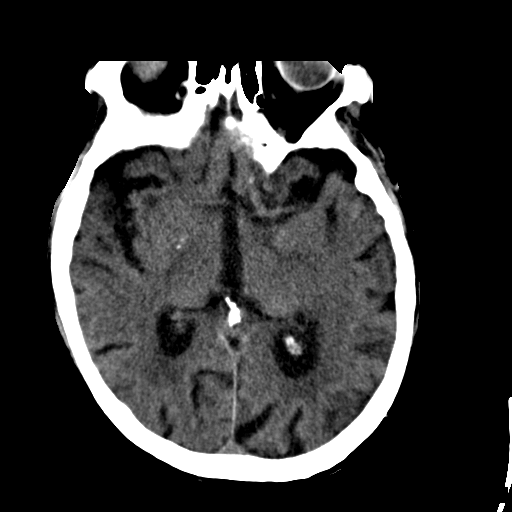
[im 18/32  brain]
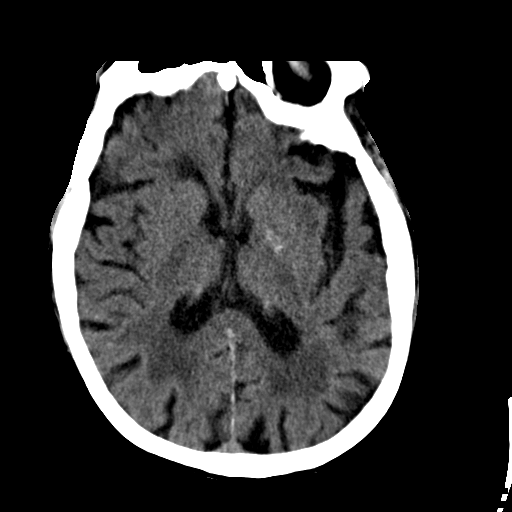
[im 20/32  brain]
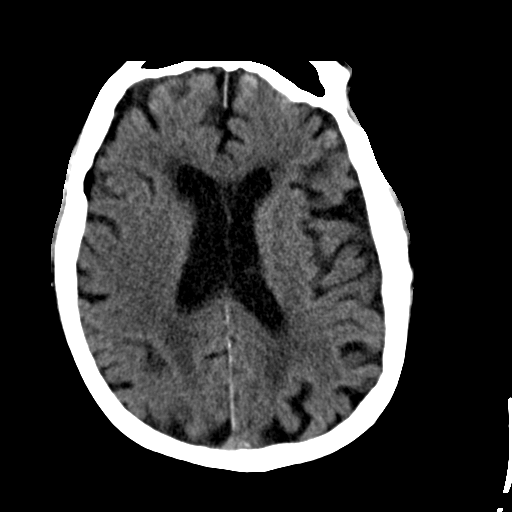
[im 20/32  bone]
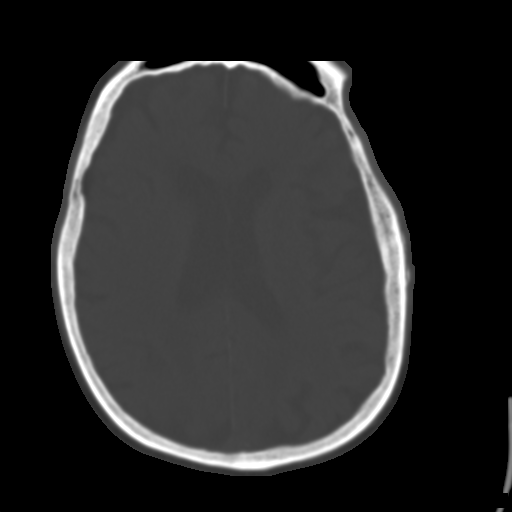
[im 23/32  brain]
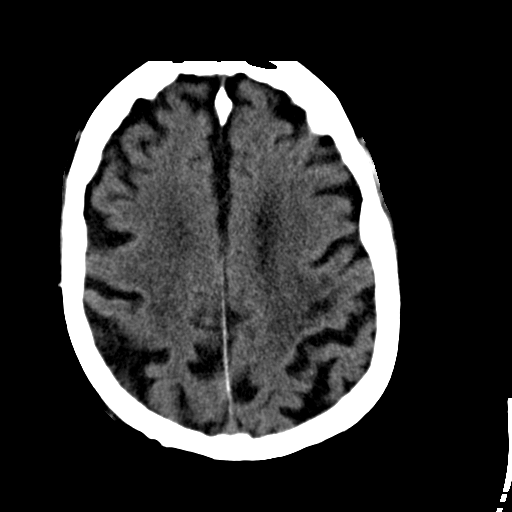
[im 25/32  brain]
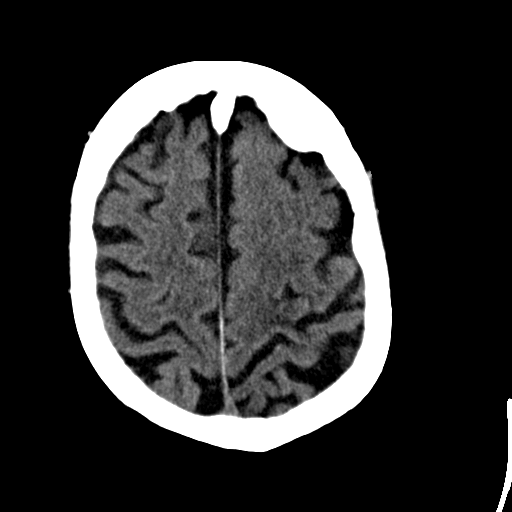
[im 27/32  brain]
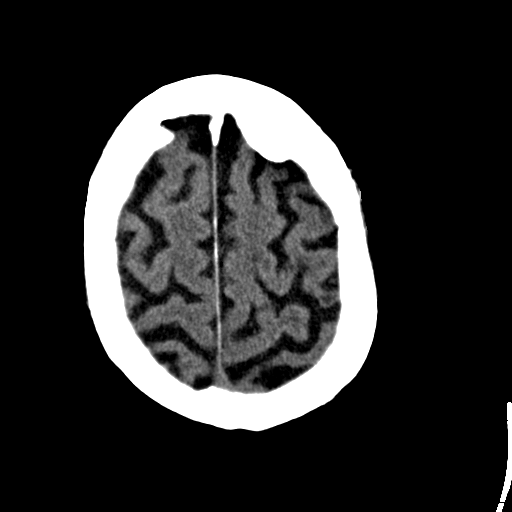
[im 29/32  brain]
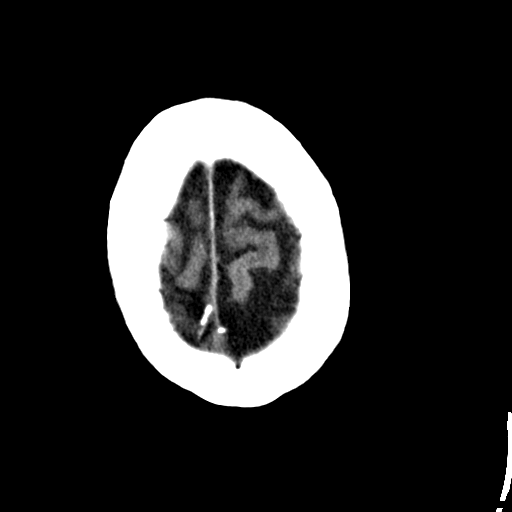
[im 29/32  bone]
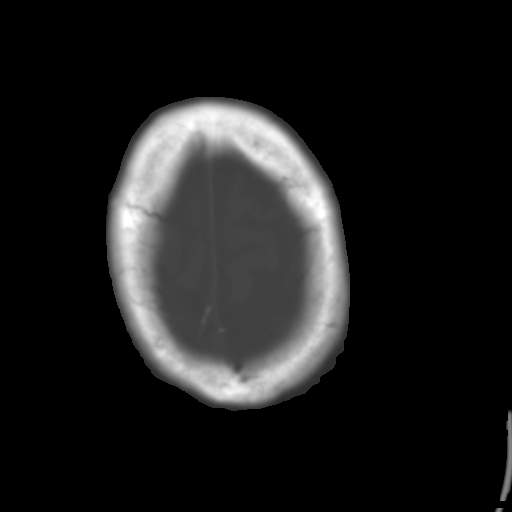

[Series 3: bone · axial · 0.42mm/px · z∈[-166,-140]mm · 2 of 34 slices shown]
[im 3/34  bone]
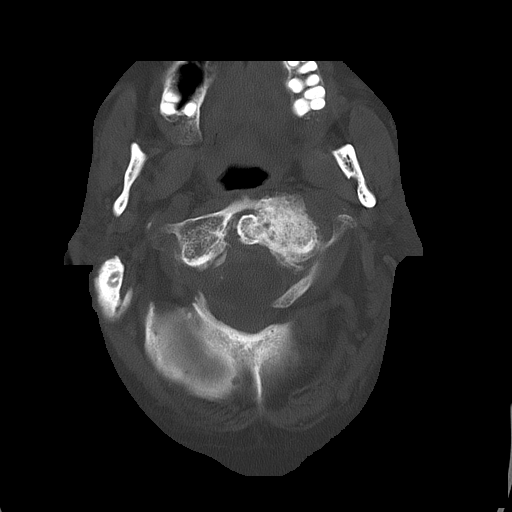
[im 8/34  bone]
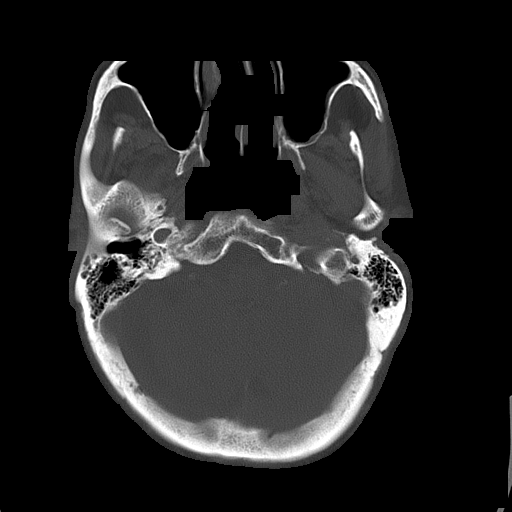

[15 of 30 positions shown; findings below may reference images not displayed]

FINDINGS: There is no evidence of mass effect, midline shift, or extra-axial fluid
collections.  There is no evidence of a space-occupying lesion or
intracranial hemorrhage. There is no evidence of a cortical-based area of
acute infarction. There is generalized cerebral atrophy. There is
periventricular white matter low attenuation likely secondary to
microangiopathy.

The ventricles and sulci are appropriate for the patient's age. The basal
cisterns are patent.

Visualized portions of the orbits are unremarkable. There is left maxillary
sinus mucosal thickening. Cerebrovascular atherosclerotic calcifications are
noted.

The osseous structures are unremarkable.
IMPRESSION: No acute intracranial process.

[REDACTED]
